# Patient Record
Sex: Male | Born: 1965 | Race: Black or African American | Hispanic: No | Marital: Married | Smoking: Never smoker
Health system: Southern US, Community
[De-identification: ages and names within clinical notes are randomized; demographics above are authoritative.]

## PROBLEM LIST (undated history)

## (undated) DIAGNOSIS — E119 Type 2 diabetes mellitus without complications: Secondary | ICD-10-CM

## (undated) DIAGNOSIS — J45909 Unspecified asthma, uncomplicated: Secondary | ICD-10-CM

---

## 2008-09-25 ENCOUNTER — Emergency Department (HOSPITAL_BASED_OUTPATIENT_CLINIC_OR_DEPARTMENT_OTHER): Admission: EM | Admit: 2008-09-25 | Discharge: 2008-09-25 | Payer: Self-pay | Admitting: Emergency Medicine

## 2008-09-25 ENCOUNTER — Ambulatory Visit: Payer: Self-pay | Admitting: Diagnostic Radiology

## 2008-09-27 ENCOUNTER — Emergency Department (HOSPITAL_BASED_OUTPATIENT_CLINIC_OR_DEPARTMENT_OTHER): Admission: EM | Admit: 2008-09-27 | Discharge: 2008-09-27 | Payer: Self-pay | Admitting: Emergency Medicine

## 2008-09-27 ENCOUNTER — Ambulatory Visit: Payer: Self-pay | Admitting: Radiology

## 2010-12-24 LAB — DIFFERENTIAL
Basophils Absolute: 0.1 10*3/uL (ref 0.0–0.1)
Basophils Relative: 1 % (ref 0–1)
Eosinophils Absolute: 0.1 10*3/uL (ref 0.0–0.7)
Neutro Abs: 4.2 10*3/uL (ref 1.7–7.7)
Neutrophils Relative %: 58 % (ref 43–77)

## 2010-12-24 LAB — COMPREHENSIVE METABOLIC PANEL
ALT: 24 U/L (ref 0–53)
Alkaline Phosphatase: 64 U/L (ref 39–117)
BUN: 6 mg/dL (ref 6–23)
CO2: 27 mEq/L (ref 19–32)
GFR calc non Af Amer: >60 mL/min (ref >60)
Glucose, Bld: 232 mg/dL — ABNORMAL HIGH (ref 70–99)
Potassium: 3.7 mEq/L (ref 3.5–5.1)
Sodium: 140 mEq/L (ref 135–145)
Total Protein: 7.9 g/dL (ref 6.0–8.3)

## 2010-12-24 LAB — POCT I-STAT 3, ART BLOOD GAS (G3+)
pCO2 arterial: 26.3 mmHg — ABNORMAL LOW (ref 35.0–45.0)
pH, Arterial: 7.545 — ABNORMAL HIGH (ref 7.350–7.450)
pO2, Arterial: 69 mmHg — ABNORMAL LOW (ref 80.0–100.0)

## 2010-12-24 LAB — CBC
HCT: 41.3 % (ref 39.0–52.0)
Hemoglobin: 12.8 g/dL — ABNORMAL LOW (ref 13.0–17.0)
MCHC: 31 g/dL (ref 30.0–36.0)
RBC: 5.88 MIL/uL — ABNORMAL HIGH (ref 4.22–5.81)
RDW: 13.3 % (ref 11.5–15.5)

## 2011-01-21 ENCOUNTER — Emergency Department (HOSPITAL_BASED_OUTPATIENT_CLINIC_OR_DEPARTMENT_OTHER)
Admission: EM | Admit: 2011-01-21 | Discharge: 2011-01-21 | Disposition: A | Discharge status: Home / Self Care | Payer: BC Managed Care – HMO | Attending: Emergency Medicine | Admitting: Emergency Medicine

## 2011-01-21 DIAGNOSIS — S01501A Unspecified open wound of lip, initial encounter: Secondary | ICD-10-CM | POA: Insufficient documentation

## 2011-01-21 DIAGNOSIS — X58XXXA Exposure to other specified factors, initial encounter: Secondary | ICD-10-CM | POA: Insufficient documentation

## 2015-07-09 ENCOUNTER — Emergency Department (HOSPITAL_BASED_OUTPATIENT_CLINIC_OR_DEPARTMENT_OTHER): Payer: BLUE CROSS/BLUE SHIELD

## 2015-07-09 ENCOUNTER — Emergency Department (HOSPITAL_BASED_OUTPATIENT_CLINIC_OR_DEPARTMENT_OTHER)
Admission: EM | Admit: 2015-07-09 | Discharge: 2015-07-09 | Disposition: A | Discharge status: Home / Self Care | Payer: BLUE CROSS/BLUE SHIELD | Attending: Emergency Medicine | Admitting: Emergency Medicine

## 2015-07-09 ENCOUNTER — Encounter (HOSPITAL_BASED_OUTPATIENT_CLINIC_OR_DEPARTMENT_OTHER): Payer: Self-pay | Admitting: Emergency Medicine

## 2015-07-09 DIAGNOSIS — R059 Cough, unspecified: Secondary | ICD-10-CM

## 2015-07-09 DIAGNOSIS — J45909 Unspecified asthma, uncomplicated: Secondary | ICD-10-CM | POA: Insufficient documentation

## 2015-07-09 DIAGNOSIS — E119 Type 2 diabetes mellitus without complications: Secondary | ICD-10-CM | POA: Insufficient documentation

## 2015-07-09 DIAGNOSIS — R05 Cough: Secondary | ICD-10-CM

## 2015-07-09 DIAGNOSIS — R51 Headache: Secondary | ICD-10-CM | POA: Insufficient documentation

## 2015-07-09 HISTORY — DX: Unspecified asthma, uncomplicated: J45.909

## 2015-07-09 HISTORY — DX: Type 2 diabetes mellitus without complications: E11.9

## 2015-07-09 MED ORDER — GUAIFENESIN-CODEINE 100-10 MG/5ML PO SOLN: 10.0000 mL | Freq: Four times a day (QID) | ORAL | Status: DC | PRN

## 2015-07-09 NOTE — Discharge Instructions (Signed)
Robitussin with codeine as prescribed as needed for cough.  Return to the emergency department if you develop chest pain, difficulty breathing, high fever, or other new and concerning symptoms.   Cough, Adult Coughing is a reflex that clears your throat and your airways. Coughing helps to heal and protect your lungs. It is normal to cough occasionally, but a cough that happens with other symptoms or lasts a long time may be a sign of a condition that needs treatment. A cough may last only 2-3 weeks (acute), or it may last longer than 8 weeks (chronic). CAUSES Coughing is commonly caused by:  Breathing in substances that irritate your lungs.  A viral or bacterial respiratory infection.  Allergies.  Asthma.  Postnasal drip.  Smoking.  Acid backing up from the stomach into the esophagus (gastroesophageal reflux).  Certain medicines.  Chronic lung problems, including COPD (or rarely, lung cancer).  Other medical conditions such as heart failure. HOME CARE INSTRUCTIONS  Pay attention to any changes in your symptoms. Take these actions to help with your discomfort:  Take medicines only as told by your health care provider.  If you were prescribed an antibiotic medicine, take it as told by your health care provider. Do not stop taking the antibiotic even if you start to feel better.  Talk with your health care provider before you take a cough suppressant medicine.  Drink enough fluid to keep your urine clear or pale yellow.  If the air is dry, use a cold steam vaporizer or humidifier in your bedroom or your home to help loosen secretions.  Avoid anything that causes you to cough at work or at home.  If your cough is worse at night, try sleeping in a semi-upright position.  Avoid cigarette smoke. If you smoke, quit smoking. If you need help quitting, ask your health care provider.  Avoid caffeine.  Avoid alcohol.  Rest as needed. SEEK MEDICAL CARE IF:   You have new  symptoms.  You cough up pus.  Your cough does not get better after 2-3 weeks, or your cough gets worse.  You cannot control your cough with suppressant medicines and you are losing sleep.  You develop pain that is getting worse or pain that is not controlled with pain medicines.  You have a fever.  You have unexplained weight loss.  You have night sweats. SEEK IMMEDIATE MEDICAL CARE IF:  You cough up blood.  You have difficulty breathing.  Your heartbeat is very fast.   This information is not intended to replace advice given to you by your health care provider. Make sure you discuss any questions you have with your health care provider.   Document Released: 02/22/2011 Document Revised: 05/17/2015 Document Reviewed: 11/02/2014 Elsevier Interactive Patient Education [employer] Inc.

## 2015-07-09 NOTE — ED Provider Notes (Signed)
CSN: [id number]     Arrival date & time 07/09/15  2010 History  By signing my name below, I, Ronney Lion, attest that this documentation has been prepared under the direction and in the presence of Geoffery Lyons, MD. Electronically Signed: Ronney Lion, ED Scribe. 07/09/2015. 11:17PM.    Chief Complaint  Patient presents with  . Cough   The history is provided by the patient. No language interpreter was used.    HPI Comments: [facility] Walsh is a 49 y.o. male with a history of DM and asthma, who presents to the Emergency Department complaining of a constant dry cough. He states he came in tonight because he had a headache tonight after a prolonged coughing episode. Talking exacerbates his cough. Patient states he had a similar cough in the past that progressively developed into pneumonia, so he came in today to prevent that from recurring. Patient takes Ramapril for HTN. He states he has not had an asthmatic exacerbation in several years, but he has an inhaler that he uses in case. He denies a history of smoking. He also denies myalgias, fever, chest pain, or SOB.   Past Medical History  Diagnosis Date  . Diabetes mellitus without complication (HCC)   . Asthma    History reviewed. No pertinent past surgical history. History reviewed. No pertinent family history. Social History  Substance Use Topics  . Smoking status: Never Smoker   . Smokeless tobacco: None  . Alcohol Use: Yes    Review of Systems  Constitutional: Negative for fever.  Respiratory: Positive for cough. Negative for shortness of breath.   Cardiovascular: Negative for chest pain.  Musculoskeletal: Negative for myalgias.  All other systems reviewed and are negative.  Allergies  Review of patient's allergies indicates no known allergies.  Home Medications   Prior to Admission medications   Not on File   BP 143/83 mmHg  Pulse 80  Temp(Src) 98.3 F (36.8 C) (Oral)  Resp 20  Ht  (1.753 m)  Wt 180 lb (81.647 kg)   BMI 26.57 kg/m2  SpO2 99% Physical Exam  Constitutional: He is oriented to person, place, and time. He appears well-developed and well-nourished. No distress.  HENT:  Head: Normocephalic and atraumatic.  Mouth/Throat: Oropharynx is clear and moist.  Eyes: Conjunctivae and EOM are normal.  Neck: Neck supple. No tracheal deviation present.  Cardiovascular: Normal rate.   Pulmonary/Chest: Effort normal. No respiratory distress. He has no wheezes. He has no rales.  Musculoskeletal: Normal range of motion.  Lymphadenopathy:    He has no cervical adenopathy.  Neurological: He is alert and oriented to person, place, and time.  Skin: Skin is warm and dry.  Psychiatric: He has a normal mood and affect. His behavior is normal.  Nursing note and vitals reviewed.   ED Course  Procedures (including critical care time)  DIAGNOSTIC STUDIES: Oxygen Saturation is 99% on RA, normal by my interpretation.    COORDINATION OF CARE: 11:13 P - Suspect viral URI. Discussed treatment plan with pt at bedside which includes Rx cough medications. Strict return precautions given. Pt verbalized understanding and agreed to plan.   Imaging Review Dg Chest 2 View  07/09/2015  CLINICAL DATA:  Acute onset of cough.  Initial encounter. EXAM: CHEST  2 VIEW COMPARISON:  Chest radiograph performed 09/27/2008 FINDINGS: The lungs are well-aerated and clear. There is no evidence of focal opacification, pleural effusion or pneumothorax. The heart is normal in size; the mediastinal contour is within normal limits. No  acute osseous abnormalities are seen. IMPRESSION: No acute cardiopulmonary process seen. Electronically Signed   By: Roanna RaiderJeffery  Chang M.D.   On: 07/09/2015 20:46   I have personally reviewed and evaluated these images and lab results as part of my medical decision-making.  MDM   Final diagnoses:  None    Patient presents with complaints of cough and congestion since earlier this morning. He is on an ACE  inhibitor, however this does not sound like an ACE inhibitor induced side effect. His lungs are clear and chest x-ray reveals no acute process. I suspect he has the beginning of a viral URI. He will be given cough medication which he can take as needed.   I personally performed the services described in this documentation, which was scribed in my presence. The recorded information has been reviewed and is accurate.       Geoffery Lyonsouglas Armonie Staten, MD 07/10/15 [phone]

## 2015-07-09 NOTE — ED Notes (Signed)
Pt in c/o constant dry cough onset this am. Pt takes Ramapril. NAD.

## 2016-05-13 IMAGING — DX DG CHEST 2V
2 series · 2 of 2 positions shown · non-contrast
Comparison: Chest radiograph performed 09/27/2008

CLINICAL DATA: Acute onset of cough.  Initial encounter.

EXAM:
CHEST  2 VIEW

[chest pa]
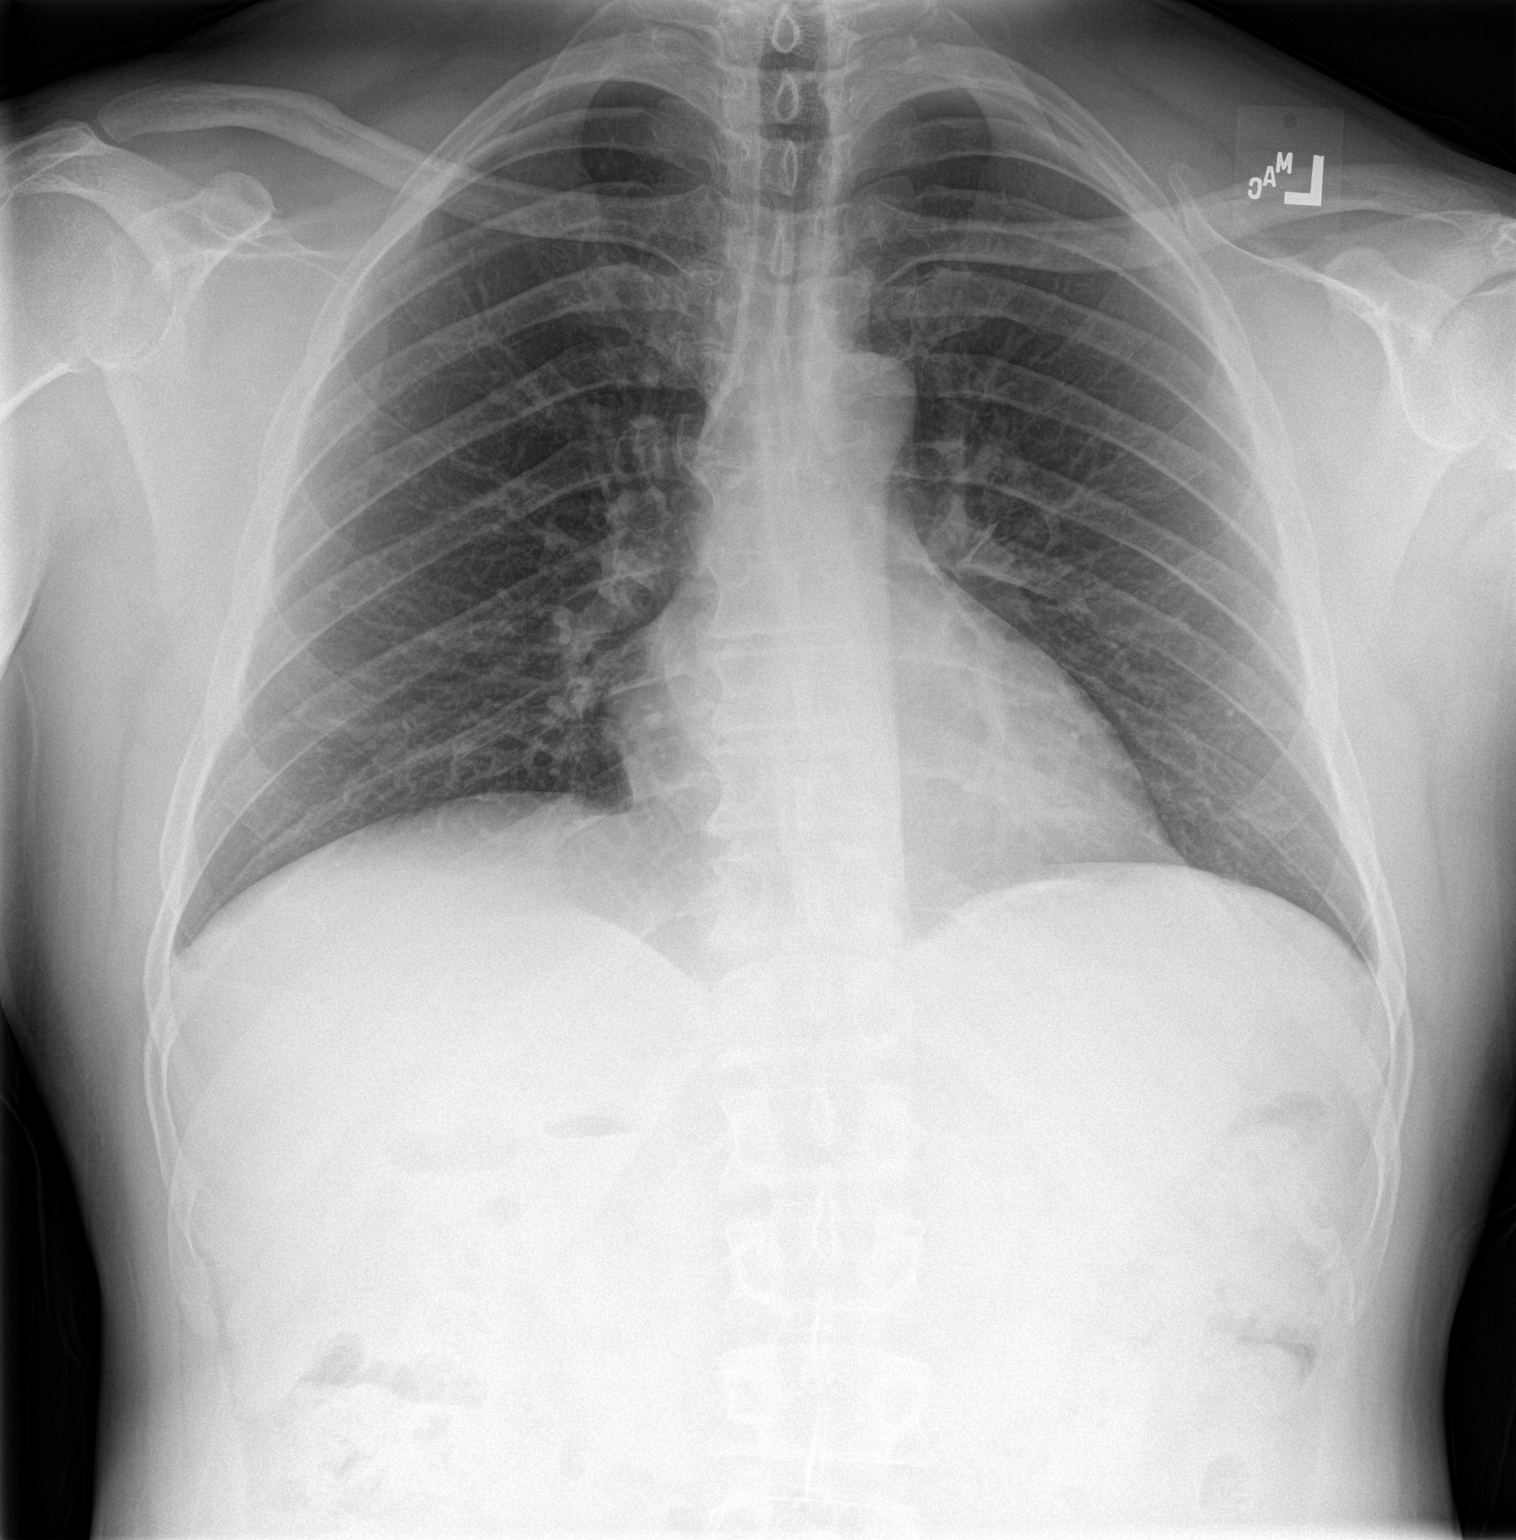

[chest lat]
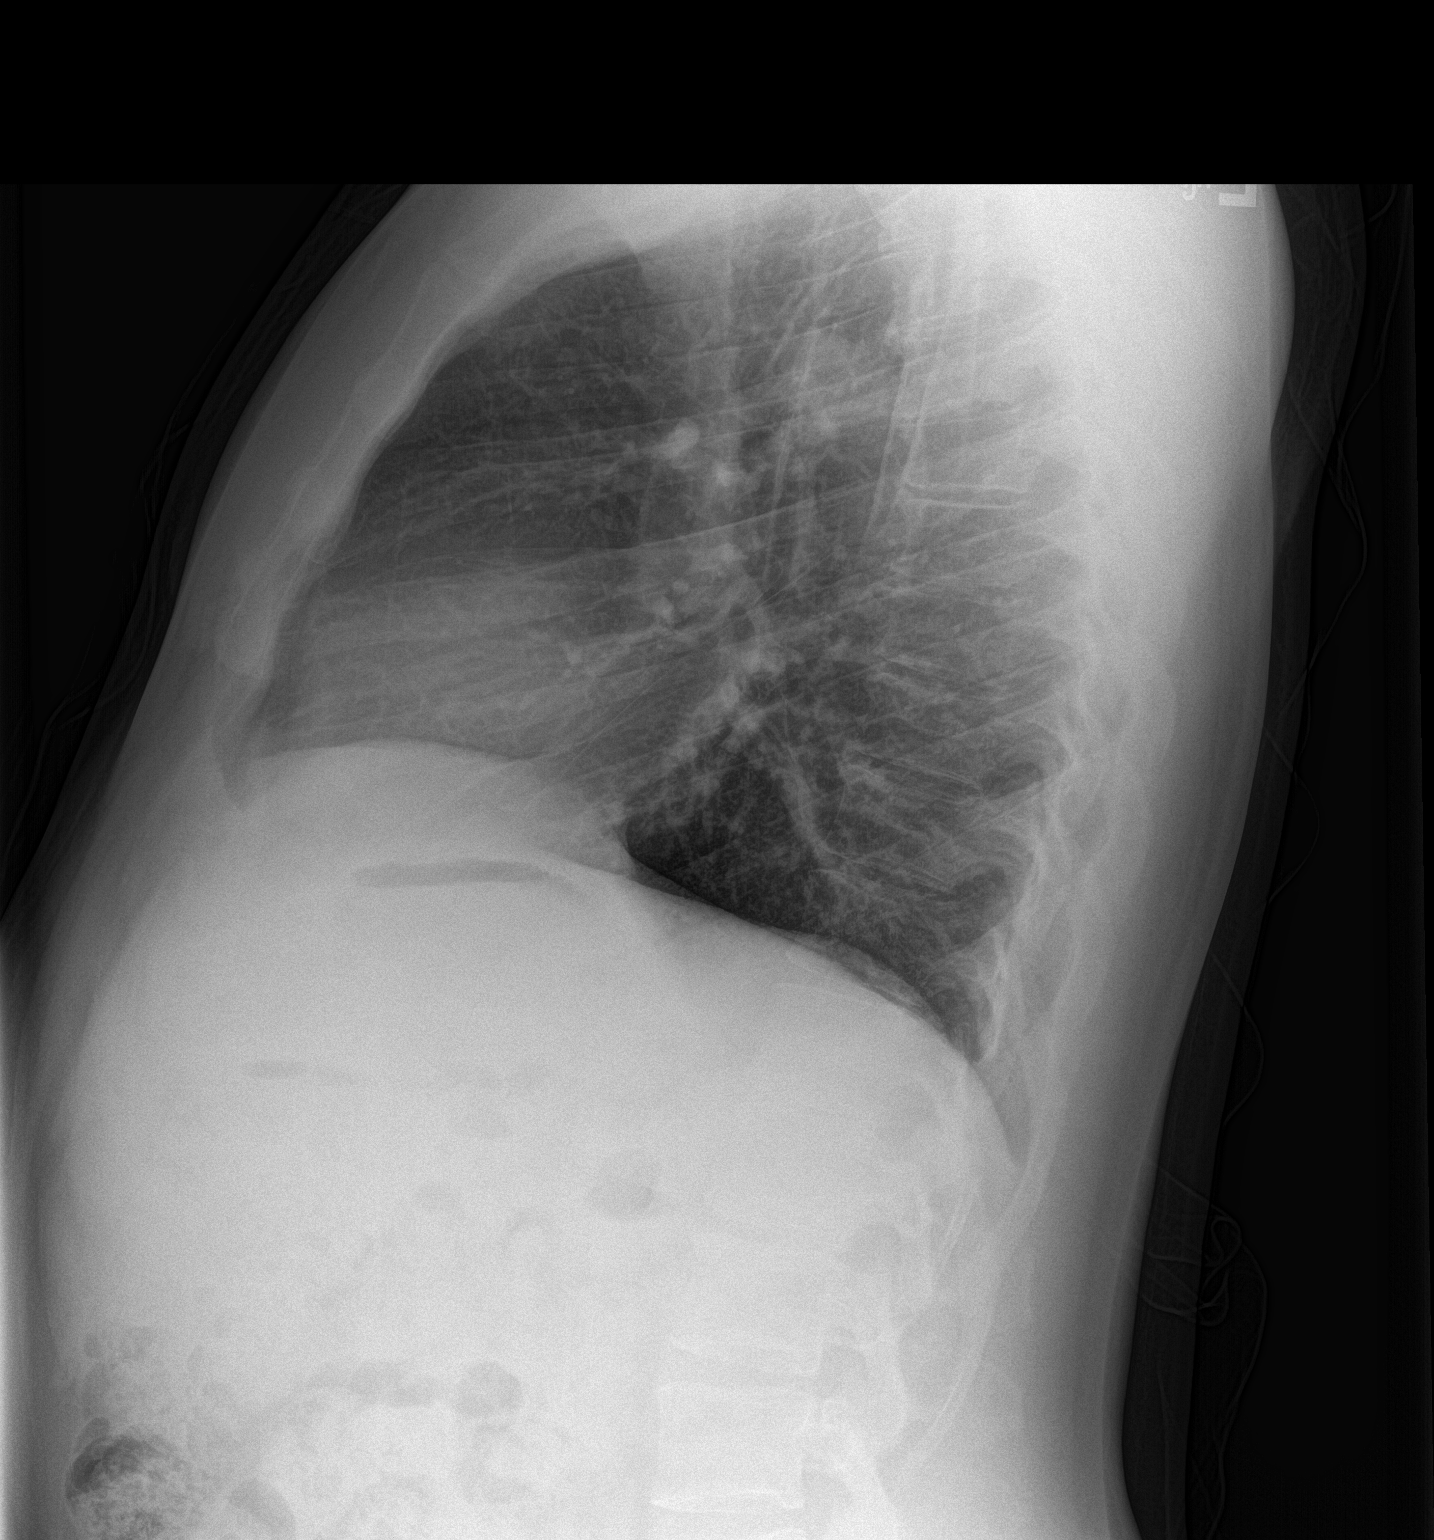

[2 of 2 positions shown; findings below may reference images not displayed]

FINDINGS: The lungs are well-aerated and clear. There is no evidence of focal
opacification, pleural effusion or pneumothorax.

The heart is normal in size; the mediastinal contour is within
normal limits. No acute osseous abnormalities are seen.
IMPRESSION: No acute cardiopulmonary process seen.

## 2019-04-21 ENCOUNTER — Other Ambulatory Visit: Payer: Self-pay

## 2019-04-21 DIAGNOSIS — Z20822 Contact with and (suspected) exposure to covid-19: Secondary | ICD-10-CM

## 2019-04-22 LAB — NOVEL CORONAVIRUS, NAA: SARS-CoV-2, NAA: NOT DETECTED

## 2019-08-07 ENCOUNTER — Encounter (HOSPITAL_BASED_OUTPATIENT_CLINIC_OR_DEPARTMENT_OTHER): Payer: Self-pay | Admitting: *Deleted

## 2019-08-07 ENCOUNTER — Emergency Department (HOSPITAL_BASED_OUTPATIENT_CLINIC_OR_DEPARTMENT_OTHER): Payer: BLUE CROSS/BLUE SHIELD

## 2019-08-07 ENCOUNTER — Emergency Department (HOSPITAL_BASED_OUTPATIENT_CLINIC_OR_DEPARTMENT_OTHER)
Admission: EM | Admit: 2019-08-07 | Discharge: 2019-08-07 | Disposition: A | Discharge status: Home / Self Care | Payer: BLUE CROSS/BLUE SHIELD | Attending: Emergency Medicine | Admitting: Emergency Medicine

## 2019-08-07 ENCOUNTER — Other Ambulatory Visit: Payer: Self-pay

## 2019-08-07 DIAGNOSIS — J45909 Unspecified asthma, uncomplicated: Secondary | ICD-10-CM | POA: Insufficient documentation

## 2019-08-07 DIAGNOSIS — U071 COVID-19: Secondary | ICD-10-CM | POA: Diagnosis not present

## 2019-08-07 DIAGNOSIS — Z7984 Long term (current) use of oral hypoglycemic drugs: Secondary | ICD-10-CM | POA: Insufficient documentation

## 2019-08-07 DIAGNOSIS — M62838 Other muscle spasm: Secondary | ICD-10-CM | POA: Diagnosis present

## 2019-08-07 DIAGNOSIS — Z79899 Other long term (current) drug therapy: Secondary | ICD-10-CM | POA: Diagnosis not present

## 2019-08-07 DIAGNOSIS — E119 Type 2 diabetes mellitus without complications: Secondary | ICD-10-CM | POA: Insufficient documentation

## 2019-08-07 LAB — COMPREHENSIVE METABOLIC PANEL
ALT: 27 U/L (ref 0–44)
AST: 23 U/L (ref 15–41)
Albumin: 3.7 g/dL (ref 3.5–5.0)
Alkaline Phosphatase: 65 U/L (ref 38–126)
Anion gap: 11 (ref 5–15)
BUN: 13 mg/dL (ref 6–20)
CO2: 27 mmol/L (ref 22–32)
Calcium: 8.5 mg/dL — ABNORMAL LOW (ref 8.9–10.3)
Chloride: 95 mmol/L — ABNORMAL LOW (ref 98–111)
Creatinine, Ser: 1.06 mg/dL (ref 0.61–1.24)
GFR calc Af Amer: >60 mL/min (ref >60)
GFR calc non Af Amer: >60 mL/min (ref >60)
Glucose, Bld: 364 mg/dL — ABNORMAL HIGH (ref 70–99)
Potassium: 4.2 mmol/L (ref 3.5–5.1)
Sodium: 133 mmol/L — ABNORMAL LOW (ref 135–145)
Total Bilirubin: 0.7 mg/dL (ref 0.3–1.2)
Total Protein: 6.7 g/dL (ref 6.5–8.1)

## 2019-08-07 LAB — CBC WITH DIFFERENTIAL/PLATELET
Abs Immature Granulocytes: 0.01 10*3/uL (ref 0.00–0.07)
Basophils Absolute: 0 10*3/uL (ref 0.0–0.1)
Basophils Relative: 0 %
Eosinophils Absolute: 0 10*3/uL (ref 0.0–0.5)
Eosinophils Relative: 0 %
HCT: 42.7 % (ref 39.0–52.0)
Hemoglobin: 13 g/dL (ref 13.0–17.0)
Immature Granulocytes: 0 %
Lymphocytes Relative: 41 %
Lymphs Abs: 2 10*3/uL (ref 0.7–4.0)
MCH: 21.9 pg — ABNORMAL LOW (ref 26.0–34.0)
MCHC: 30.4 g/dL (ref 30.0–36.0)
MCV: 72 fL — ABNORMAL LOW (ref 80.0–100.0)
Monocytes Absolute: 0.6 10*3/uL (ref 0.1–1.0)
Monocytes Relative: 12 %
Neutro Abs: 2.3 10*3/uL (ref 1.7–7.7)
Neutrophils Relative %: 47 %
Platelets: 187 10*3/uL (ref 150–400)
RBC: 5.93 MIL/uL — ABNORMAL HIGH (ref 4.22–5.81)
RDW: 13.8 % (ref 11.5–15.5)
WBC: 5 10*3/uL (ref 4.0–10.5)
nRBC: 0 % (ref 0.0–0.2)

## 2019-08-07 LAB — SARS CORONAVIRUS 2 AG (30 MIN TAT): SARS Coronavirus 2 Ag: POSITIVE — AB

## 2019-08-07 LAB — MAGNESIUM: Magnesium: 2 mg/dL (ref 1.7–2.4)

## 2019-08-07 LAB — D-DIMER, QUANTITATIVE: D-Dimer, Quant: <0.27 ug/mL-FEU (ref 0.00–0.50)

## 2019-08-07 LAB — TROPONIN I (HIGH SENSITIVITY)
Troponin I (High Sensitivity): 2 ng/L (ref <18)
Troponin I (High Sensitivity): 2 ng/L (ref <18)

## 2019-08-07 MED ORDER — ASPIRIN 81 MG PO CHEW
243.0000 mg | CHEWABLE_TABLET | Freq: Once | ORAL | Status: AC
  Administered 2019-08-07: 243 mg via ORAL
  Filled 2019-08-07: qty 3

## 2019-08-07 MED ORDER — BENZONATATE 100 MG PO CAPS: 100.0000 mg | ORAL_CAPSULE | Freq: Three times a day (TID) | ORAL | 0 refills | Status: DC

## 2019-08-07 NOTE — ED Provider Notes (Signed)
Signout from [place], [place] at shift change See previous providers note for full H&P  Briefly, patient presenting with muscle spasms as well as chest pain.  He is found to be Covid positive, however labs are pending including troponins.  D-dimer is negative.  Chest x-ray is clear.  If all is negative, discharge home with symptomatic treatment for COVID-19.  He is already taking Tessalon, OTC ibuprofen, Tylenol.  Repeat troponin is negative.  Will discharge home with refill of Tessalon, recommending ibuprofen, Tylenol.  Isolation precautions discussed.  Patient understands and agrees with plan.  Return precautions discussed.  Patient vitals stable throughout ED course and discharged in satisfactory condition.   Frederica Kuster, PA-C 08/07/19 2244    Malvin Johns, MD 08/07/19 2332

## 2019-08-07 NOTE — Discharge Instructions (Addendum)
Take Tessalon every 8 hours as needed for cough.  Alternate ibuprofen and Tylenol as prescribed over-the-counter, for your body aches and fever if it develops.  Make sure to stay well-hydrated and get plenty of rest.  Please return to the emergency department if you develop any new or worsening symptoms.

## 2019-08-07 NOTE — ED Provider Notes (Signed)
[facility] Provider Note   CSN: [id number] Arrival date & time: 08/07/19  1506     History   Chief Complaint Chief Complaint  Patient presents with   Spasms    HPI [facility] is a 53 y.o. male history diabetes and asthma.  Patient presents today for bilateral leg cramping that has been going on for 1 day.  Worsening today.  He describes a intermittent severe cramping sensation to both of his legs no clear aggravating or alleviating factors, cramps last a few seconds at a time.  He reports they are all different parts of his legs including Walsh feet, calves, anterior shins, thighs and quads.  He reports he has never had this before.  Additionally patient reports intermittent cough and shortness of breath over Walsh past 3 days he has been taking his albuterol inhaler more often than usual for his symptoms and this has been helping well.  He denies any active shortness of breath at this time.  On review of systems patient endorses is one episode of central chest pressure this morning shortly after waking up that lasted 1-2 minutes and self resolved he did take 1 aspirin during this event and it has not reoccurred, he denies radiation of pain.  Denies fever/chills, headache/vision changes, neck pain, active chest pain/shortness of breath, hemoptysis, abdominal pain, nausea/vomiting, diarrhea, extremity swelling/color change, fall/injury, weakness or any additional concerns.    HPI  Past Medical History:  Diagnosis Date   Asthma    Diabetes mellitus without complication ([place])     There are no active problems to display for this patient.   History reviewed. No pertinent surgical history.      Home Medications    Prior to Admission medications   Medication Sig Start Date End Date Taking? Authorizing Provider  butalbital-aspirin-caffeine [facility]) 50-325-40 MG tablet Take 1 tablet by mouth 2 (two) times daily as needed for headache.   Yes  [provider]  Dapagliflozin Propanediol (FARXIGA PO) Take by mouth.   Yes [provider]  guaiFENesin-codeine 100-10 MG/5ML syrup Take 10 mLs by mouth every 6 (six) hours as needed for cough. 07/09/15   Veryl Speak, MD  JANUMET 50-1000 MG tablet  02/22/19   [provider]  RAMIPRIL PO Take by mouth.    [provider]    Family History No family history on file.  Social History Social History   Tobacco Use   Smoking status: Never Smoker   Smokeless tobacco: Never Used  Substance Use Topics   Alcohol use: Yes   Drug use: Never     Allergies   Patient has no known allergies.   Review of Systems Review of Systems Ten systems are reviewed and are negative for acute change except as noted in Walsh HPI   Physical Exam Updated Vital Signs BP 108/61 (BP Location: Right Arm) Comment: Simultaneous filing. User may not have seen previous data.   Pulse 67 Comment: Simultaneous filing. User may not have seen previous data.   Temp 98.1 F (36.7 C) (Oral)    Resp 18    Ht 5\' 9"  (1.753 m)    Wt 79.4 kg    SpO2 100% Comment: Simultaneous filing. User may not have seen previous data.   BMI 25.84 kg/m   Physical Exam Constitutional:      General: He is not in acute distress.    Appearance: Normal appearance. He is well-developed. He is not ill-appearing or diaphoretic.  HENT:  Head: Normocephalic and atraumatic.     Right Ear: External ear normal.     Left Ear: External ear normal.     Nose: Nose normal.     Mouth/Throat:     Mouth: Mucous membranes are moist.     Pharynx: Oropharynx is clear.  Eyes:     General: Vision grossly intact. Gaze aligned appropriately.     Pupils: Pupils are equal, round, and reactive to light.  Neck:     Musculoskeletal: Normal range of motion.     Trachea: Trachea and phonation normal. No tracheal deviation.  Cardiovascular:     Rate and Rhythm: Normal rate and regular rhythm.     Pulses: Normal pulses.       Heart sounds: Normal heart sounds.  Pulmonary:     Effort: Pulmonary effort is normal. No respiratory distress.     Breath sounds: Normal breath sounds.  Abdominal:     General: There is no distension.     Palpations: Abdomen is soft.     Tenderness: There is no abdominal tenderness. There is no guarding or rebound.  Musculoskeletal: Normal range of motion.     Right lower leg: No edema.     Left lower leg: No edema.  Skin:    General: Skin is warm and dry.  Neurological:     Mental Status: He is alert.     GCS: GCS eye subscore is 4. GCS verbal subscore is 5. GCS motor subscore is 6.     Comments: Speech is clear and goal oriented, follows commands Major Cranial nerves without deficit, no facial droop Moves extremities without ataxia, coordination intact  Psychiatric:        Behavior: Behavior normal.      ED Treatments / Results  Labs (all labs ordered are listed, but only abnormal results are displayed) Labs Reviewed  SARS CORONAVIRUS 2 AG (30 MIN TAT) - Abnormal; Notable for Walsh following components:      Result Value   SARS Coronavirus 2 Ag POSITIVE (*)    All other components within normal limits  CBC WITH DIFFERENTIAL/PLATELET - Abnormal; Notable for Walsh following components:   RBC 5.93 (*)    MCV 72.0 (*)    MCH 21.9 (*)    All other components within normal limits  COMPREHENSIVE METABOLIC PANEL - Abnormal; Notable for Walsh following components:   Sodium 133 (*)    Chloride 95 (*)    Glucose, Bld 364 (*)    Calcium 8.5 (*)    All other components within normal limits  MAGNESIUM  D-DIMER, QUANTITATIVE (NOT AT [facility])  TROPONIN I (HIGH SENSITIVITY)    EKG EKG Interpretation  Date/Time:  Saturday August 07 2019 17:11:23 EST Ventricular Rate:  69 PR Interval:    QRS Duration: 87 QT Interval:  376 QTC Calculation: 403 R Axis:   0 Text Interpretation: Sinus rhythm Low voltage, precordial leads since last tracing no significant change Confirmed by  Rolan BuccoBelfi, Melanie [phone]([id number]) on 08/07/2019 6:09:43 PM   Radiology Dg Chest Portable 1 View  Result Date: 08/07/2019 CLINICAL DATA:  Cough, fever EXAM: PORTABLE CHEST 1 VIEW COMPARISON:  Chest radiograph 07/09/2015 FINDINGS: Monitoring leads overlie Walsh patient. Stable cardiac and mediastinal contours. No consolidative pulmonary opacities. No pleural effusion or pneumothorax. Thoracic spine degenerative changes. IMPRESSION: No acute cardiopulmonary process. Electronically Signed   By: Annia Beltrew  Davis M.D.   On: 08/07/2019 17:57    Procedures Procedures (including critical care time)  Medications Ordered in ED Medications  aspirin chewable tablet 243 mg (243 mg Oral Given 08/07/19 1720)     Initial Impression / Assessment and Plan / ED Course  I have reviewed Walsh triage vital signs and Walsh nursing notes.  Pertinent labs & imaging results that were available during my care of Walsh patient were reviewed by me and considered in my medical decision making (see chart for details).     On initial evaluation patient is well-appearing in no acute distress denies any active chest pain or shortness of breath.  Chest pain self resolved this morning he took 181 mg aspirin at that time.  No shortness of breath for Walsh past few hours after using his albuterol.  He reports ongoing cramping of his legs since yesterday.  Cranial nerves intact, no neuro deficit, no meningeal signs, heart regular rate and rhythm, lungs clear, abdomen soft nontender without peritoneal signs.  He is neurovascular intact to all 4 extremities without evidence of DVT.  Equal strength bilaterally sensation and capillary refill intact.  He has intermittent shortness of breath for Walsh past 3 days, also endorses decreased p.o. intake and fatigue, COVID-19 test will be obtained.  Additionally will obtain CBC, BMP, troponin, D-dimer, chest x-ray and EKG.  Patient will be given Walsh remainder of aspirin. - EKG: Sinus rhythm Low voltage, precordial  leads since last tracing no significant change Confirmed by Rolan Bucco [phone]) on 08/07/2019 6:09:43 PM  CBC nonacute CMP with elevated glucose, otherwise nonacute D-dimer negative Magnesium 2.0 Covid positive - Troponin, chest x-ray is pending. - Patient reevaluated her resting comfortably no acute distress.  States understanding of diagnosis of COVID-19 virus has no further questions.  Denies any active chest pain, vital signs pulse 70 bpm, SPO2 100% on room air, normotensive. - Care handoff given to Buel Ream PA-C at shift change.  Plan of care is to follow-up on remaining labs and imaging, anticipate discharge.  Final disposition per oncoming team.  Rivaldo Hineman was evaluated in Emergency Department on 08/07/2019 for Walsh symptoms described in Walsh history of present illness. He was evaluated in Walsh context of Walsh global COVID-19 pandemic, which necessitated consideration that Walsh patient might be at risk for infection with Walsh SARS-CoV-2 virus that causes COVID-19. Institutional protocols and algorithms that pertain to Walsh evaluation of patients at risk for COVID-19 are in a state of rapid change based on information released by regulatory bodies including Walsh CDC and federal and state organizations. These policies and algorithms were followed during Walsh patient's care in Walsh ED.  Note: Portions of this report may have been transcribed using voice recognition software. Every effort was made to ensure accuracy; however, inadvertent computerized transcription errors may still be present. Final Clinical Impressions(s) / ED Diagnoses   Final diagnoses:  COVID-19 virus infection    ED Discharge Orders    None       Elizabeth Palau 08/07/19 1811    Rolan Bucco, MD 08/07/19 2332

## 2019-08-07 NOTE — ED Triage Notes (Addendum)
Pt reports intermittent muscle cramps in both legs last night. Took magnesium. Also taking albuterol w/steroids and benzonatate for a cough he has had since yesterday

## 2019-08-08 ENCOUNTER — Telehealth: Payer: Self-pay | Admitting: Unknown Physician Specialty

## 2019-08-08 NOTE — Telephone Encounter (Signed)
Called to Discuss with patient about Covid symptoms and the use of bamlanivimab, a monoclonal antibody infusion for those with mild to moderate Covid symptoms and at a high risk of hospitalization.     Pt is qualified for this infusion at the [facility] due to co-morbid conditions and/or a member of an at-risk group.     Unable to reach pt  

## 2019-08-10 ENCOUNTER — Telehealth: Payer: Self-pay | Admitting: Nurse Practitioner

## 2019-08-10 NOTE — Telephone Encounter (Signed)
Called to Discuss with patient about Covid symptoms and the use of bamlanivimab, a monoclonal antibody infusion for those with mild to moderate Covid symptoms and at a high risk of hospitalization.     Pt is qualified for this infusion at the [facility] due to co-morbid conditions and/or a member of an at-risk group.     Unable to reach pt  

## 2020-06-11 IMAGING — DX DG CHEST 1V PORT
1 series · 1 of 1 positions shown · non-contrast
Comparison: Chest radiograph 07/09/2015

CLINICAL DATA: Cough, fever

EXAM:
PORTABLE CHEST 1 VIEW

[chest ap]
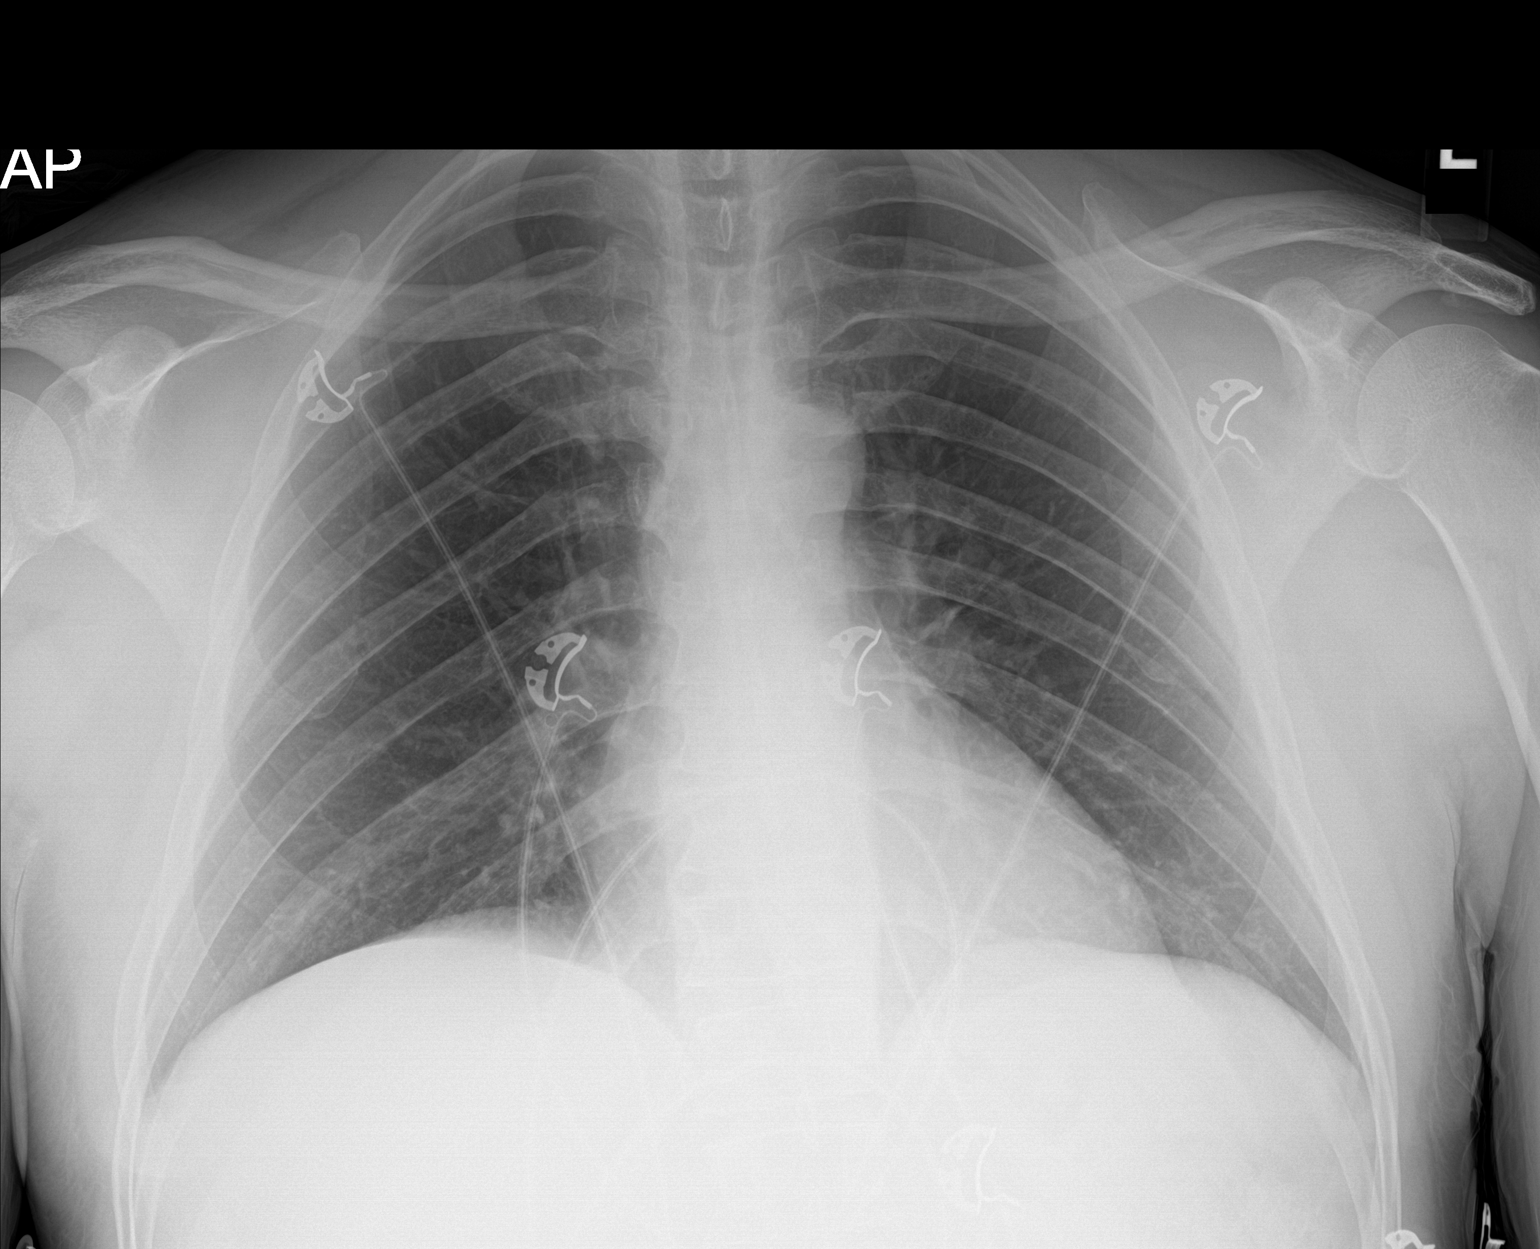

[1 of 1 positions shown; findings below may reference images not displayed]

FINDINGS: Monitoring leads overlie the patient. Stable cardiac and mediastinal
contours. No consolidative pulmonary opacities. No pleural effusion
or pneumothorax. Thoracic spine degenerative changes.
IMPRESSION: No acute cardiopulmonary process.

## 2022-01-19 ENCOUNTER — Other Ambulatory Visit: Payer: Self-pay

## 2022-01-19 ENCOUNTER — Emergency Department (HOSPITAL_BASED_OUTPATIENT_CLINIC_OR_DEPARTMENT_OTHER)
Admission: EM | Admit: 2022-01-19 | Discharge: 2022-01-20 | Disposition: A | Discharge status: Home / Self Care | Payer: BC Managed Care – PPO | Attending: Emergency Medicine | Admitting: Emergency Medicine

## 2022-01-19 ENCOUNTER — Emergency Department (HOSPITAL_BASED_OUTPATIENT_CLINIC_OR_DEPARTMENT_OTHER): Payer: BC Managed Care – PPO | Admitting: Radiology

## 2022-01-19 DIAGNOSIS — R059 Cough, unspecified: Secondary | ICD-10-CM | POA: Diagnosis present

## 2022-01-19 DIAGNOSIS — Z20822 Contact with and (suspected) exposure to covid-19: Secondary | ICD-10-CM | POA: Diagnosis not present

## 2022-01-19 DIAGNOSIS — J9801 Acute bronchospasm: Secondary | ICD-10-CM | POA: Insufficient documentation

## 2022-01-19 DIAGNOSIS — J40 Bronchitis, not specified as acute or chronic: Secondary | ICD-10-CM | POA: Insufficient documentation

## 2022-01-19 MED ORDER — BENZONATATE 100 MG PO CAPS
200.0000 mg | ORAL_CAPSULE | Freq: Once | ORAL | Status: AC
  Administered 2022-01-19: 200 mg via ORAL
  Filled 2022-01-19: qty 2

## 2022-01-19 MED ORDER — ALBUTEROL SULFATE (2.5 MG/3ML) 0.083% IN NEBU: 2.5000 mg | INHALATION_SOLUTION | Freq: Once | RESPIRATORY_TRACT | Status: AC

## 2022-01-19 MED ORDER — AZITHROMYCIN 250 MG PO TABS
500.0000 mg | ORAL_TABLET | Freq: Once | ORAL | Status: AC
  Administered 2022-01-19: 500 mg via ORAL
  Filled 2022-01-19: qty 2

## 2022-01-19 MED ORDER — ALBUTEROL SULFATE HFA 108 (90 BASE) MCG/ACT IN AERS
2.0000 | INHALATION_SPRAY | Freq: Once | RESPIRATORY_TRACT | Status: AC
  Administered 2022-01-19: 2 via RESPIRATORY_TRACT
  Filled 2022-01-19: qty 6.7

## 2022-01-19 MED ORDER — ALBUTEROL SULFATE (2.5 MG/3ML) 0.083% IN NEBU
INHALATION_SOLUTION | RESPIRATORY_TRACT | Status: AC
  Administered 2022-01-19: 2.5 mg via RESPIRATORY_TRACT
  Filled 2022-01-19: qty 3

## 2022-01-19 NOTE — ED Triage Notes (Signed)
He reports recent (1-2 days) cough without fever. He tells [place] that "The cough is real bad and getting worse". Our R.T., Misty Stanley evaluates him here in triage. He is in no distress. ?

## 2022-01-20 LAB — RESP PANEL BY RT-PCR (FLU A&B, COVID) ARPGX2
Influenza A by PCR: NEGATIVE
Influenza B by PCR: NEGATIVE
SARS Coronavirus 2 by RT PCR: NEGATIVE

## 2022-01-20 MED ORDER — PREDNISONE 20 MG PO TABS: ORAL_TABLET | ORAL | 0 refills | Status: DC

## 2022-01-20 MED ORDER — HYDROCODONE-ACETAMINOPHEN 7.5-325 MG/15ML PO SOLN
ORAL | 0 refills | Status: DC
Start: 2022-01-20 — End: 2022-10-15

## 2022-01-20 MED ORDER — PREDNISONE 50 MG PO TABS
60.0000 mg | ORAL_TABLET | Freq: Once | ORAL | Status: AC
Start: 2022-01-20 — End: 2022-01-20
  Administered 2022-01-20: 60 mg via ORAL
  Filled 2022-01-20: qty 1

## 2022-01-20 MED ORDER — BENZONATATE 200 MG PO CAPS: 200.0000 mg | ORAL_CAPSULE | Freq: Three times a day (TID) | ORAL | 0 refills | Status: DC | PRN

## 2022-01-20 MED ORDER — AZITHROMYCIN 250 MG PO TABS: 250.0000 mg | ORAL_TABLET | Freq: Every day | ORAL | 0 refills | Status: DC

## 2022-01-20 NOTE — ED Provider Notes (Signed)
?[facility] EMERGENCY DEPT ?Provider Note ? ? ?CSN: [id number] ?Arrival date & time: 01/19/22  1737 ? ?  ? ?History ? ?Chief Complaint  ?Patient presents with  ? URI  ? ? ?Aaron Walsh is a 56 y.o. male. ? ?HPI ?Patient reports he has severe cough that is causing central chest pain.  He reports he feels somewhat short of breath when he is having coughing episodes.  He denies he had a documented fever at this time.  He does have general fatigue and malaise.  No sore throat or nasal congestion.  No vomiting or diarrhea.  No lower extremity swelling or calf pain.  Patient reports he has had similar symptoms a few times a year for many years.  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  He reports he has had bouts of pneumonia and prior hospitalization. ?  ? ?Home Medications ?Prior to Admission medications   ?Medication Sig Start Date End Date Taking? Authorizing Provider  ?azithromycin (ZITHROMAX) 250 MG tablet Take 1 tablet (250 mg total) by mouth daily. 01/20/22  Yes Charlesetta Shanks, MD  ?benzonatate (TESSALON) 200 MG capsule Take 1 capsule (200 mg total) by mouth 3 (three) times daily as needed for cough. 01/20/22  Yes Charlesetta Shanks, MD  ?HYDROcodone-acetaminophen (HYCET) 7.5-325 mg/15 ml solution 15 mL every 6 hours as needed for coughing. 01/20/22  Yes Charlesetta Shanks, MD  ?predniSONE (DELTASONE) 20 MG tablet 2 tabs po daily x 4 days 01/20/22  Yes Bren Borys, Jeannie Done, MD  ?benzonatate (TESSALON) 100 MG capsule Take 1 capsule (100 mg total) by mouth every 8 (eight) hours. 08/07/19   Law, Bea Graff, PA-C  ?butalbital-aspirin-caffeine [facility]) 50-325-40 MG tablet Take 1 tablet by mouth 2 (two) times daily as needed for headache.    [provider]  ?Dapagliflozin Propanediol (FARXIGA PO) Take by mouth.    [provider]  ?guaiFENesin-codeine 100-10 MG/5ML syrup Take 10 mLs by mouth every 6 (six) hours as needed for cough. 07/09/15   Veryl Speak, MD  ?Carlyn Reichert  50-1000 MG tablet  02/22/19   [provider]  ?RAMIPRIL PO Take by mouth.    [provider]  ?   ? ?Allergies    ?Patient has no known allergies.   ? ?Review of Systems   ?Review of Systems ?10 systems reviewed and negative except as per HPI ?Physical Exam ?Updated Vital Signs ?BP 136/84 (BP Location: Right Arm)   Pulse 67   Temp 98.3 ?F (36.8 ?C) (Oral)   Resp 16   SpO2 100%  ?Physical Exam ?Constitutional:   ?   Comments: Alert nontoxic.  No respiratory distress.  Intermittent harsh cough paroxysms  ?HENT:  ?   Nose: Nose normal.  ?   Mouth/Throat:  ?   Mouth: Mucous membranes are moist.  ?   Pharynx: Oropharynx is clear.  ?Eyes:  ?   Extraocular Movements: Extraocular movements intact.  ?Cardiovascular:  ?   Rate and Rhythm: Normal rate and regular rhythm.  ?Pulmonary:  ?   Comments: No respiratory distress but severe cough paroxysms.  Scattered slight expiratory wheeze but good airflow to the bases. ?Abdominal:  ?   General: There is no distension.  ?   Palpations: Abdomen is soft.  ?   Tenderness: There is no abdominal tenderness. There is no guarding.  ?Musculoskeletal:     ?   General: No swelling or tenderness. Normal range of motion.  ?   Right lower leg: No  edema.  ?   Left lower leg: No edema.  ?Skin: ?   General: Skin is warm and dry.  ?Neurological:  ?   General: No focal deficit present.  ?   Mental Status: He is oriented to person, place, and time.  ?   Motor: No weakness.  ?   Coordination: Coordination normal.  ?Psychiatric:     ?   Mood and Affect: Mood normal.  ? ? ?ED Results / Procedures / Treatments   ?Labs ?(all labs ordered are listed, but only abnormal results are displayed) ?Labs Reviewed  ?RESP PANEL BY RT-PCR (FLU A&B, COVID) [id number]  ? ? ?EKG ?None ? ?Radiology ?DG Chest 2 View ? ?Result Date: 01/19/2022 ?CLINICAL DATA:  Cough. EXAM: CHEST - 2 VIEW COMPARISON:  August 07, 2019 FINDINGS: The heart size and mediastinal contours are within normal limits. Both lungs  are clear. The visualized skeletal structures are unremarkable. IMPRESSION: No active cardiopulmonary disease. Electronically Signed   By: Virgina Norfolk M.D.   On: 01/19/2022 18:36   ? ?Procedures ?Procedures  ? ? ?Medications Ordered in ED ?Medications  ?predniSONE (DELTASONE) tablet 60 mg (has no administration in time range)  ?albuterol (PROVENTIL) (2.5 MG/3ML) 0.083% nebulizer solution 2.5 mg (2.5 mg Nebulization Given 01/19/22 1801)  ?albuterol (VENTOLIN HFA) 108 (90 Base) MCG/ACT inhaler 2 puff (2 puffs Inhalation Given 01/19/22 2253)  ?benzonatate (TESSALON) capsule 200 mg (200 mg Oral Given 01/19/22 2305)  ?azithromycin (ZITHROMAX) tablet 500 mg (500 mg Oral Given 01/19/22 2305)  ? ? ?ED Course/ Medical Decision Making/ A&P ?  ?                        ?Medical Decision Making ?Amount and/or Complexity of Data Reviewed ?Radiology: ordered. ? ?Risk ?Prescription drug management. ? ? ?Patient presents as outlined.  Chest x-ray reviewed by radiology does not show any acute findings.  Differential includes bronchitis\pneumonia\bronchospasm\COVID. ? ?COVID test negative. ? ?Patient treated with inhaled albuterol, Tessalon Perles, Zithromax and prednisone history of recurrent pneumonia and bronchitis. Patient does not have significant respiratory distress or hypoxia.  At this time I do not feel that he needs admission.  He is stable for continued home management with careful return precautions. ? ? ? ? ? ? ?Final Clinical Impression(s) / ED Diagnoses ?Final diagnoses:  ?Bronchitis  ?Bronchospasm  ? ? ?Rx / DC Orders ?ED Discharge Orders   ? ?      Ordered  ?  benzonatate (TESSALON) 200 MG capsule  3 times daily PRN       ? 01/20/22 0030  ?  HYDROcodone-acetaminophen (HYCET) 7.5-325 mg/15 ml solution       ? 01/20/22 0030  ?  azithromycin (ZITHROMAX) 250 MG tablet  Daily       ? 01/20/22 0030  ?  predniSONE (DELTASONE) 20 MG tablet       ? 01/20/22 0030  ? ?  ?  ? ?  ? ? ?  ?Charlesetta Shanks, MD ?01/20/22 [phone] ? ?

## 2022-01-20 NOTE — ED Notes (Signed)
Pt verbalizes understanding of discharge instructions. Opportunity for questioning and answers were provided. Pt discharged from ED to home.   ? ?

## 2022-01-20 NOTE — Discharge Instructions (Addendum)
1.  You were given your first dose of prednisone and Zithromax in the emergency department.  Start these prescriptions the day after your visit. ?2.  You may take Tessalon Perles every 8 hours for coughing.  Swallow them whole.  Do not suck on them or chew on them.  If you need additional cough medication, you may take Hycet as prescribed every 6 hours.  This medication contains acetaminophen so do not take additional acetaminophen with it.  Use the albuterol inhaler every 4-6 hours as needed ?3.  Schedule follow-up appoint with your doctor within 3 to 7 days ?4.  Return to emergency department if you have new worsening or concerning symptoms ?

## 2022-03-22 ENCOUNTER — Other Ambulatory Visit: Payer: Self-pay

## 2022-03-22 ENCOUNTER — Emergency Department (HOSPITAL_BASED_OUTPATIENT_CLINIC_OR_DEPARTMENT_OTHER)
Admission: EM | Admit: 2022-03-22 | Discharge: 2022-03-23 | Disposition: A | Discharge status: Home / Self Care | Payer: BC Managed Care – PPO | Attending: Emergency Medicine | Admitting: Emergency Medicine

## 2022-03-22 ENCOUNTER — Encounter (HOSPITAL_BASED_OUTPATIENT_CLINIC_OR_DEPARTMENT_OTHER): Payer: Self-pay | Admitting: Emergency Medicine

## 2022-03-22 ENCOUNTER — Emergency Department (HOSPITAL_BASED_OUTPATIENT_CLINIC_OR_DEPARTMENT_OTHER): Payer: BC Managed Care – PPO

## 2022-03-22 DIAGNOSIS — R1013 Epigastric pain: Secondary | ICD-10-CM

## 2022-03-22 DIAGNOSIS — Z7984 Long term (current) use of oral hypoglycemic drugs: Secondary | ICD-10-CM | POA: Diagnosis not present

## 2022-03-22 DIAGNOSIS — J45909 Unspecified asthma, uncomplicated: Secondary | ICD-10-CM | POA: Diagnosis not present

## 2022-03-22 DIAGNOSIS — K529 Noninfective gastroenteritis and colitis, unspecified: Secondary | ICD-10-CM | POA: Diagnosis not present

## 2022-03-22 DIAGNOSIS — E119 Type 2 diabetes mellitus without complications: Secondary | ICD-10-CM | POA: Insufficient documentation

## 2022-03-22 LAB — BASIC METABOLIC PANEL
Anion gap: 13 (ref 5–15)
BUN: 12 mg/dL (ref 6–20)
CO2: 24 mmol/L (ref 22–32)
Calcium: 10.5 mg/dL — ABNORMAL HIGH (ref 8.9–10.3)
Chloride: 99 mmol/L (ref 98–111)
Creatinine, Ser: 1.08 mg/dL (ref 0.61–1.24)
GFR, Estimated: >60 mL/min (ref >60)
Glucose, Bld: 187 mg/dL — ABNORMAL HIGH (ref 70–99)
Potassium: 4.7 mmol/L (ref 3.5–5.1)
Sodium: 136 mmol/L (ref 135–145)

## 2022-03-22 LAB — CBG MONITORING, ED: Glucose-Capillary: 187 mg/dL — ABNORMAL HIGH (ref 70–99)

## 2022-03-22 LAB — CBC
HCT: 47.2 % (ref 39.0–52.0)
Hemoglobin: 14.4 g/dL (ref 13.0–17.0)
MCH: 21.9 pg — ABNORMAL LOW (ref 26.0–34.0)
MCHC: 30.5 g/dL (ref 30.0–36.0)
MCV: 71.6 fL — ABNORMAL LOW (ref 80.0–100.0)
Platelets: 167 10*3/uL (ref 150–400)
RBC: 6.59 MIL/uL — ABNORMAL HIGH (ref 4.22–5.81)
RDW: 15.1 % (ref 11.5–15.5)
WBC: 5.1 10*3/uL (ref 4.0–10.5)
nRBC: 0 % (ref 0.0–0.2)

## 2022-03-22 LAB — URINALYSIS, ROUTINE W REFLEX MICROSCOPIC
Bilirubin Urine: NEGATIVE
Glucose, UA: >1000 mg/dL — AB
Hgb urine dipstick: NEGATIVE
Ketones, ur: 15 mg/dL — AB
Leukocytes,Ua: NEGATIVE
Nitrite: NEGATIVE
Protein, ur: NEGATIVE mg/dL
Specific Gravity, Urine: 1.034 — ABNORMAL HIGH (ref 1.005–1.030)
pH: 5 (ref 5.0–8.0)

## 2022-03-22 MED ORDER — IOHEXOL 300 MG/ML  SOLN
100.0000 mL | Freq: Once | INTRAMUSCULAR | Status: AC | PRN
  Administered 2022-03-22: 85 mL via INTRAVENOUS

## 2022-03-22 MED ORDER — SODIUM CHLORIDE 0.9 % IV BOLUS
1000.0000 mL | Freq: Once | INTRAVENOUS | Status: AC
Start: 2022-03-22 — End: 2022-03-23
  Administered 2022-03-22: 1000 mL via INTRAVENOUS

## 2022-03-22 MED ORDER — PANTOPRAZOLE SODIUM 40 MG IV SOLR
40.0000 mg | Freq: Once | INTRAVENOUS | Status: AC
  Administered 2022-03-22: 40 mg via INTRAVENOUS
  Filled 2022-03-22: qty 10

## 2022-03-22 MED ORDER — SODIUM CHLORIDE 0.9 % IV SOLN: INTRAVENOUS | Status: DC

## 2022-03-22 NOTE — ED Provider Notes (Signed)
[facility] EMERGENCY DEPT Provider Note   CSN: [id number] Arrival date & time: 03/22/22  1947     History {Add pertinent medical, surgical, social history, OB history to HPI:1} Chief Complaint  Patient presents with   Weakness    Aaron Walsh is a 56 y.o. male.  HPI     Home Medications Prior to Admission medications   Medication Sig Start Date End Date Taking? Authorizing Provider  azithromycin (ZITHROMAX) 250 MG tablet Take 1 tablet (250 mg total) by mouth daily. 01/20/22   Arby Barrette, MD  benzonatate (TESSALON) 100 MG capsule Take 1 capsule (100 mg total) by mouth every 8 (eight) hours. 08/07/19   Law, Waylan Boga, PA-C  benzonatate (TESSALON) 200 MG capsule Take 1 capsule (200 mg total) by mouth 3 (three) times daily as needed for cough. 01/20/22   Arby Barrette, MD  butalbital-aspirin-caffeine [facility]) [phone] MG tablet Take 1 tablet by mouth 2 (two) times daily as needed for headache.    [provider]  Dapagliflozin Propanediol (FARXIGA PO) Take by mouth.    [provider]  guaiFENesin-codeine 100-10 MG/5ML syrup Take 10 mLs by mouth every 6 (six) hours as needed for cough. 07/09/15   Geoffery Lyons, MD  HYDROcodone-acetaminophen (HYCET) 7.5-325 mg/15 ml solution 15 mL every 6 hours as needed for coughing. 01/20/22   Arby Barrette, MD  JANUMET 50-1000 MG tablet  02/22/19   [provider]  predniSONE (DELTASONE) 20 MG tablet 2 tabs po daily x 4 days 01/20/22   Arby Barrette, MD  RAMIPRIL PO Take by mouth.    [provider]      Allergies    Patient has no known allergies.    Review of Systems   Review of Systems  Physical Exam Updated Vital Signs BP 130/86 (BP Location: Right Arm)   Pulse 60   Temp 98.2 F (36.8 C)   Resp 18   SpO2 100%  Physical Exam  ED Results / Procedures / Treatments   Labs (all labs ordered are listed, but only abnormal results are displayed) Labs Reviewed  BASIC  METABOLIC PANEL - Abnormal; Notable for the following components:      Result Value   Glucose, Bld 187 (*)    Calcium 10.5 (*)    All other components within normal limits  CBC - Abnormal; Notable for the following components:   RBC 6.59 (*)    MCV 71.6 (*)    MCH 21.9 (*)    All other components within normal limits  URINALYSIS, ROUTINE W REFLEX MICROSCOPIC - Abnormal; Notable for the following components:   Color, Urine COLORLESS (*)    Specific Gravity, Urine 1.034 (*)    Glucose, UA >1,000 (*)    Ketones, ur 15 (*)    All other components within normal limits  CBG MONITORING, ED - Abnormal; Notable for the following components:   Glucose-Capillary 187 (*)    All other components within normal limits    EKG EKG Interpretation  Date/Time:  Friday March 22 2022 20:07:32 EDT Ventricular Rate:  88 PR Interval:  152 QRS Duration: 78 QT Interval:  358 QTC Calculation: 433 R Axis:   60 Text Interpretation: Normal sinus rhythm Cannot rule out Anterior infarct , age undetermined Abnormal ECG When compared with ECG of 07-Aug-2019 17:11, PREVIOUS ECG IS PRESENT Confirmed by Vanetta Mulders [phone]) on 03/22/2022 10:07:18 PM  Radiology No results found.  Procedures Procedures  {Document cardiac monitor, telemetry assessment procedure when appropriate:1}  Medications  Ordered in ED Medications - No data to display  ED Course/ Medical Decision Making/ A&P                           Medical Decision Making Amount and/or Complexity of Data Reviewed Labs: ordered.   ***  {Document critical care time when appropriate:1} {Document review of labs and clinical decision tools ie heart score, Chads2Vasc2 etc:1}  {Document your independent review of radiology images, and any outside records:1} {Document your discussion with family members, caretakers, and with consultants:1} {Document social determinants of health affecting pt's care:1} {Document your decision making why or why not  admission, treatments were needed:1} Final Clinical Impression(s) / ED Diagnoses Final diagnoses:  None    Rx / DC Orders ED Discharge Orders     None

## 2022-03-22 NOTE — ED Triage Notes (Signed)
Started this morning feels weak and like his heart was racing. No appetite, pain after eating. BP at home 95/58. Felt fine yesterday.

## 2022-03-23 MED ORDER — PANTOPRAZOLE SODIUM 20 MG PO TBEC: 20.0000 mg | DELAYED_RELEASE_TABLET | Freq: Every day | ORAL | 0 refills | Status: DC

## 2022-03-23 MED ORDER — CIPROFLOXACIN HCL 500 MG PO TABS
500.0000 mg | ORAL_TABLET | Freq: Two times a day (BID) | ORAL | 0 refills | Status: DC
Start: 2022-03-23 — End: 2022-10-15

## 2022-03-23 NOTE — Discharge Instructions (Signed)
CT raises concern for very proximal inflammation of the small intestines.  No complications.  Would take the Protonix take the antibiotic Cipro as directed.  Would definitely make an appointment follow-up with Dr. Audley Hose.  Upper endoscopy may be important to take a look and for further follow-up.  Return for any new or worse symptoms.

## 2022-10-15 ENCOUNTER — Other Ambulatory Visit: Payer: Self-pay

## 2022-10-16 MED ORDER — ATORVASTATIN CALCIUM 40 MG PO TABS: 40.0000 mg | ORAL_TABLET | Freq: Every day | ORAL | 0 refills | Status: DC

## 2022-10-16 MED ORDER — TRESIBA FLEXTOUCH 200 UNIT/ML ~~LOC~~ SOPN: 10.0000 [IU] | PEN_INJECTOR | Freq: Every day | SUBCUTANEOUS | 0 refills | Status: DC

## 2022-10-16 MED ORDER — DAPAGLIFLOZIN PROPANEDIOL 10 MG PO TABS: 10.0000 mg | ORAL_TABLET | Freq: Every day | ORAL | 0 refills | Status: DC

## 2022-10-22 ENCOUNTER — Ambulatory Visit: Payer: PRIVATE HEALTH INSURANCE | Admitting: Internal Medicine

## 2022-10-28 ENCOUNTER — Other Ambulatory Visit: Payer: Self-pay

## 2022-10-28 MED ORDER — INSULIN PEN NEEDLE 31G X 8 MM MISC: 3 refills | Status: DC

## 2022-11-13 ENCOUNTER — Other Ambulatory Visit: Payer: Self-pay

## 2022-11-13 ENCOUNTER — Ambulatory Visit: Payer: PRIVATE HEALTH INSURANCE | Admitting: Internal Medicine

## 2022-11-13 DIAGNOSIS — E785 Hyperlipidemia, unspecified: Secondary | ICD-10-CM

## 2022-11-13 DIAGNOSIS — E119 Type 2 diabetes mellitus without complications: Secondary | ICD-10-CM

## 2022-11-13 DIAGNOSIS — I10 Essential (primary) hypertension: Secondary | ICD-10-CM

## 2022-11-13 DIAGNOSIS — J45998 Other asthma: Secondary | ICD-10-CM

## 2022-11-13 MED ORDER — DAPAGLIFLOZIN PROPANEDIOL 10 MG PO TABS: 10.0000 mg | ORAL_TABLET | Freq: Every day | ORAL | 3 refills | Status: DC

## 2022-11-13 MED ORDER — ATORVASTATIN CALCIUM 40 MG PO TABS: 40.0000 mg | ORAL_TABLET | Freq: Every day | ORAL | 0 refills | Status: DC

## 2022-11-13 MED ORDER — JANUMET 50-1000 MG PO TABS: 1.0000 | ORAL_TABLET | Freq: Two times a day (BID) | ORAL | 3 refills | Status: DC

## 2022-11-13 MED ORDER — PANTOPRAZOLE SODIUM 20 MG PO TBEC: 20.0000 mg | DELAYED_RELEASE_TABLET | Freq: Every day | ORAL | 0 refills | Status: DC

## 2022-11-13 MED ORDER — BREZTRI AEROSPHERE 160-9-4.8 MCG/ACT IN AERO: 2.0000 | INHALATION_SPRAY | Freq: Two times a day (BID) | RESPIRATORY_TRACT | 3 refills | Status: DC

## 2022-11-13 MED ORDER — PANTOPRAZOLE SODIUM 20 MG PO TBEC: 20.0000 mg | DELAYED_RELEASE_TABLET | Freq: Every day | ORAL | 3 refills | Status: DC

## 2022-11-13 MED ORDER — FLUTICASONE PROPIONATE 50 MCG/ACT NA SUSP: 2.0000 | Freq: Every day | NASAL | 3 refills | Status: AC

## 2022-11-24 IMAGING — DX DG CHEST 2V
2 series · 2 of 2 positions shown · non-contrast
Comparison: August 07, 2019

CLINICAL DATA: Cough.

EXAM:
CHEST - 2 VIEW

[chest pa]
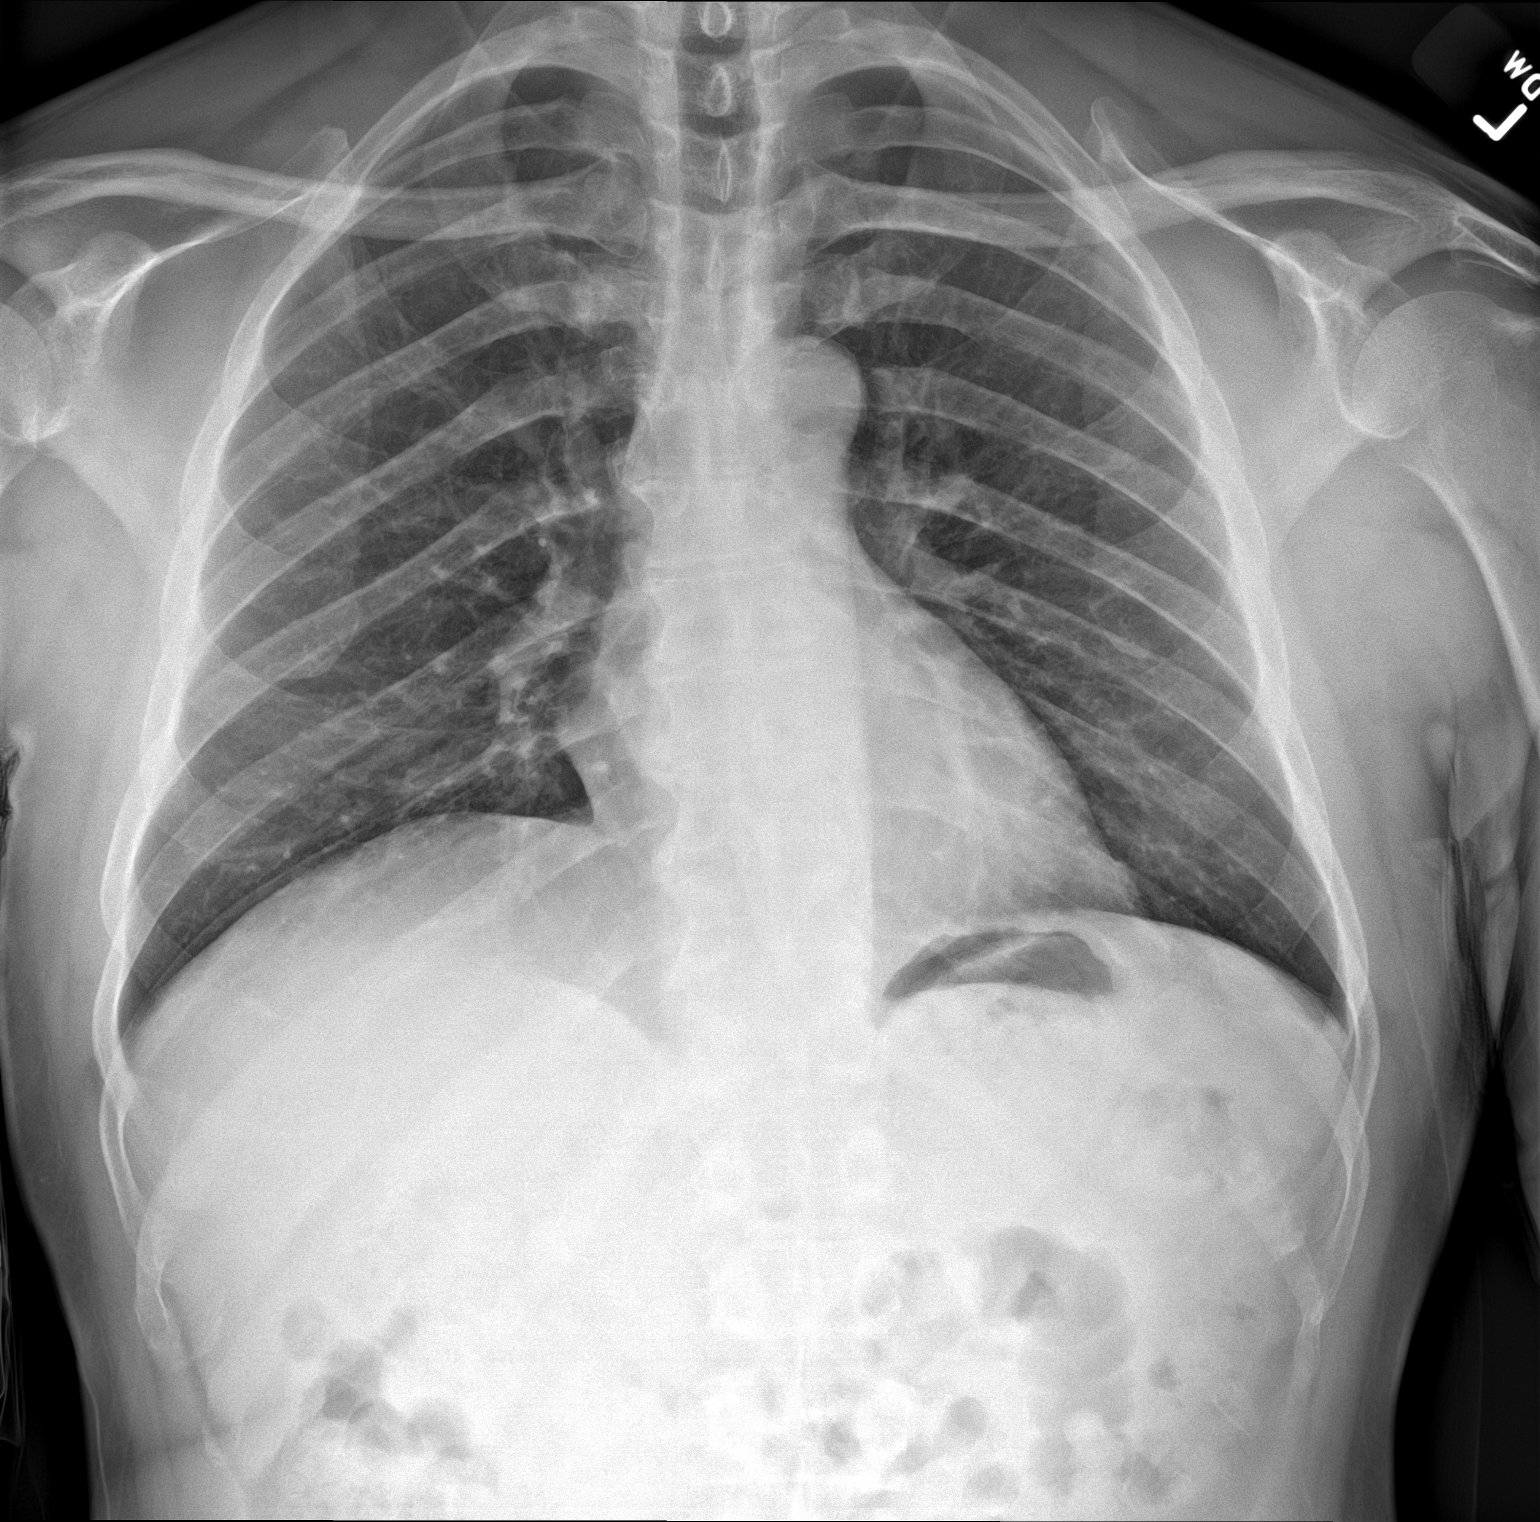

[chest lat]
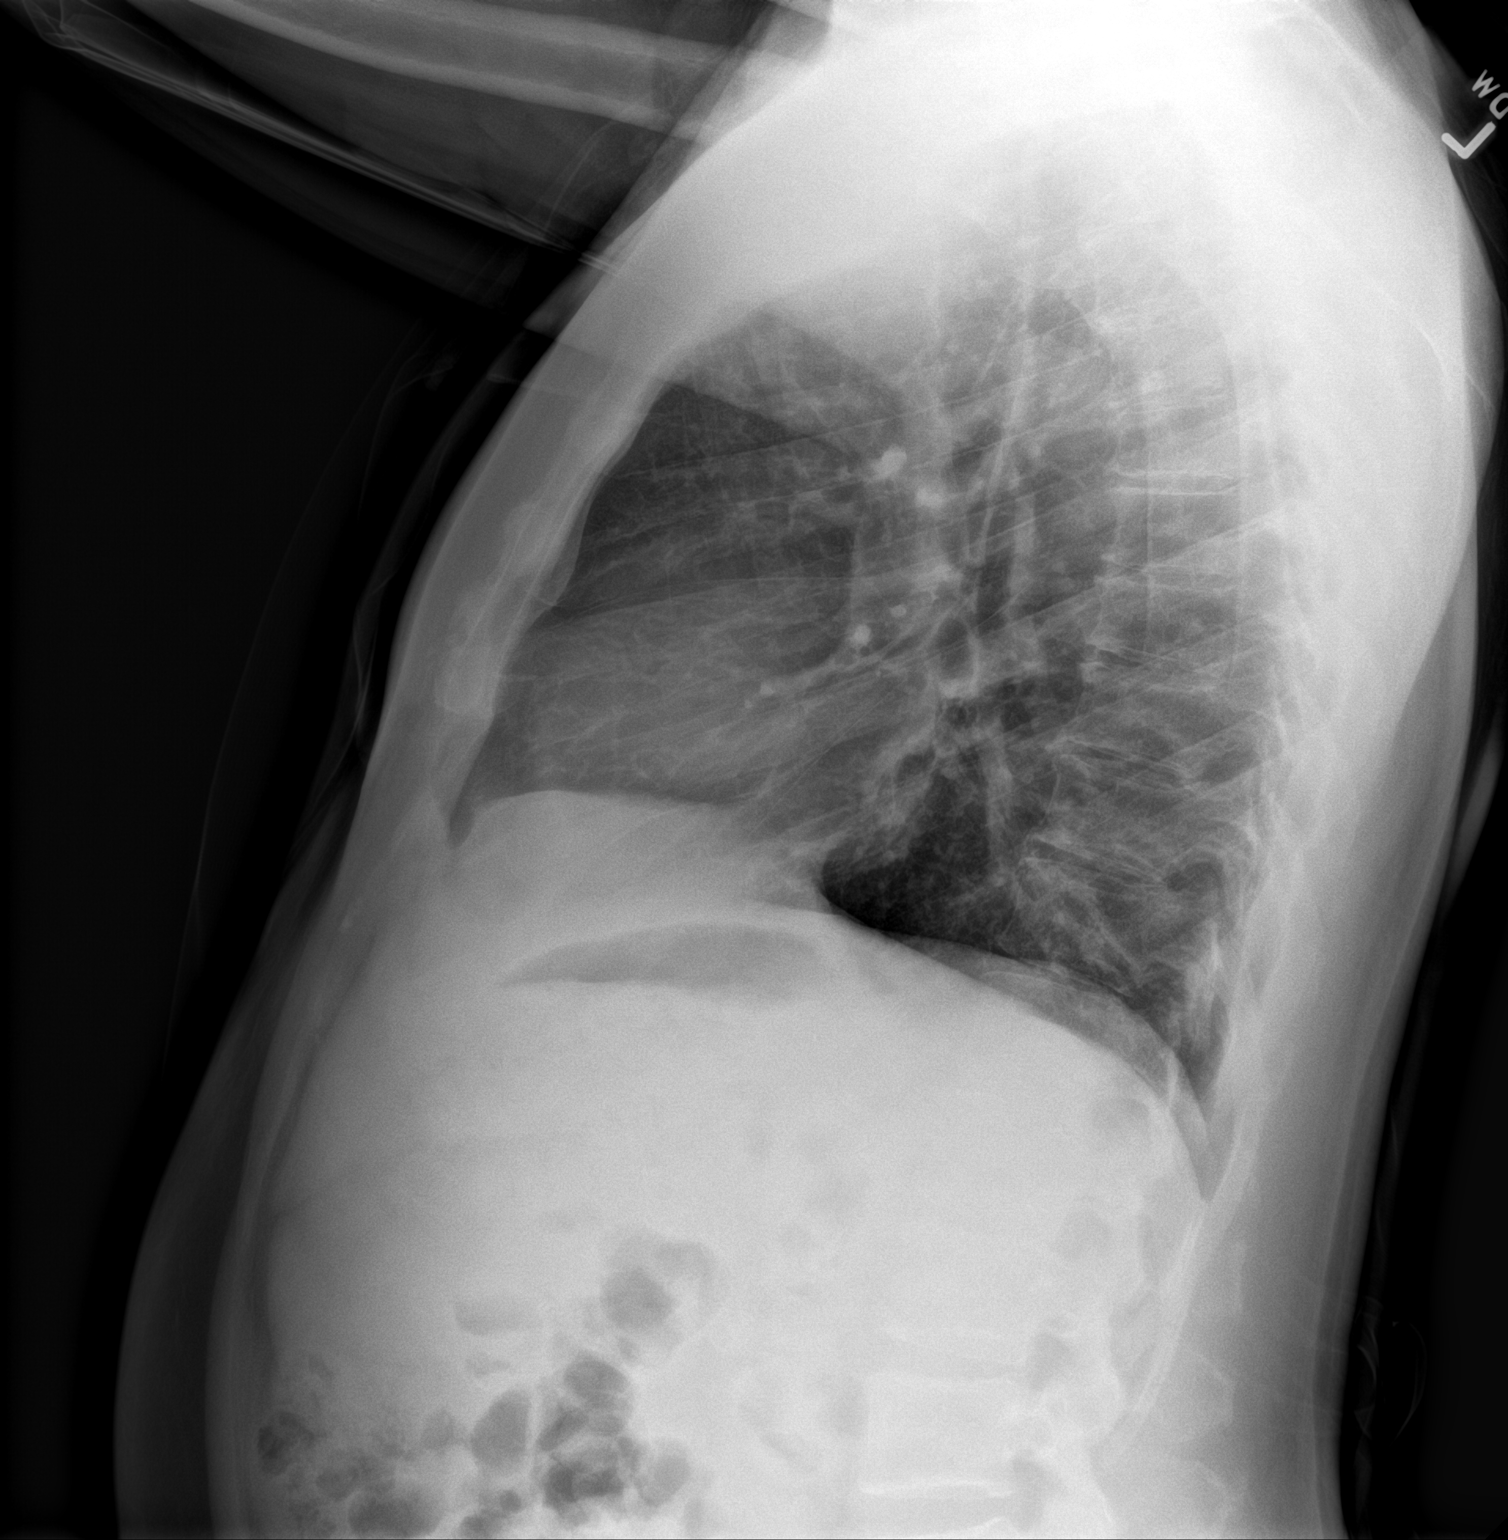

[2 of 2 positions shown; findings below may reference images not displayed]

FINDINGS: The heart size and mediastinal contours are within normal limits.
Both lungs are clear. The visualized skeletal structures are
unremarkable.
IMPRESSION: No active cardiopulmonary disease.

## 2022-12-02 ENCOUNTER — Ambulatory Visit: Payer: PRIVATE HEALTH INSURANCE | Admitting: Internal Medicine

## 2022-12-02 ENCOUNTER — Telehealth: Payer: Self-pay

## 2022-12-02 NOTE — Telephone Encounter (Signed)
Patient LM with answering service asking for call back about his libre sensor and his FMLA and possible PA needed for [employer]

## 2022-12-30 ENCOUNTER — Ambulatory Visit (INDEPENDENT_AMBULATORY_CARE_PROVIDER_SITE_OTHER): Payer: PRIVATE HEALTH INSURANCE | Admitting: Internal Medicine

## 2022-12-30 ENCOUNTER — Encounter: Payer: Self-pay | Admitting: Internal Medicine

## 2022-12-30 VITALS — BP 134/70 | HR 98 | Ht 68.0 in | Wt 170.0 lb

## 2022-12-30 DIAGNOSIS — J4520 Mild intermittent asthma, uncomplicated: Secondary | ICD-10-CM

## 2022-12-30 DIAGNOSIS — R051 Acute cough: Secondary | ICD-10-CM | POA: Insufficient documentation

## 2022-12-30 DIAGNOSIS — I10 Essential (primary) hypertension: Secondary | ICD-10-CM | POA: Insufficient documentation

## 2022-12-30 DIAGNOSIS — E1165 Type 2 diabetes mellitus with hyperglycemia: Secondary | ICD-10-CM | POA: Insufficient documentation

## 2022-12-30 DIAGNOSIS — Z794 Long term (current) use of insulin: Secondary | ICD-10-CM

## 2022-12-30 DIAGNOSIS — E782 Mixed hyperlipidemia: Secondary | ICD-10-CM | POA: Insufficient documentation

## 2022-12-30 DIAGNOSIS — J301 Allergic rhinitis due to pollen: Secondary | ICD-10-CM | POA: Insufficient documentation

## 2022-12-30 DIAGNOSIS — J45909 Unspecified asthma, uncomplicated: Secondary | ICD-10-CM | POA: Insufficient documentation

## 2022-12-30 MED ORDER — LORATADINE 10 MG PO TABS
10.0000 mg | ORAL_TABLET | Freq: Every day | ORAL | 2 refills | Status: DC
Start: 2022-12-30 — End: 2023-01-29

## 2022-12-30 MED ORDER — BENZONATATE 100 MG PO CAPS
100.0000 mg | ORAL_CAPSULE | Freq: Three times a day (TID) | ORAL | 1 refills | Status: AC | PRN
Start: 2022-12-30 — End: 2023-12-30

## 2022-12-30 MED ORDER — AZITHROMYCIN 250 MG PO TABS
ORAL_TABLET | ORAL | 0 refills | Status: AC
Start: 2022-12-30 — End: 2023-01-04

## 2022-12-30 MED ORDER — PREDNISONE 20 MG PO TABS
40.0000 mg | ORAL_TABLET | Freq: Every day | ORAL | 0 refills | Status: AC
Start: 2022-12-30 — End: 2023-01-04

## 2022-12-30 NOTE — Progress Notes (Signed)
Established Patient Office Visit  Subjective:  Patient ID: Aaron Walsh, male    DOB: 12-18-1965  Age: 57 y.o. MRN: [id number]  Chief Complaint  Patient presents with   Follow-up    Cough    Patient comes in with 3-day history of chest congestion and a productive cough which is progressively getting worse.  Patient had similar episode last year with the arrival of spring.  He does have allergies to pollen.  Quickly gets to his lungs and he starts coughing with production of sputum.  He is also having mild shortness of breath and wheezing.   Denies fever or chills, no bodyaches.  No nausea vomiting or diarrhea. He does not have any nasal or sinus congestion, no nasal discharge, and no sore throat.  Most of his symptoms are limited to his chest. Get chest x-ray today and start treatment.    No other concerns at this time.   Past Medical History:  Diagnosis Date   Asthma    Diabetes mellitus without complication     History reviewed. No pertinent surgical history.  Social History   Socioeconomic History   Marital status: Married    Spouse name: Not on file   Number of children: Not on file   Years of education: Not on file   Highest education level: Not on file  Occupational History   Not on file  Tobacco Use   Smoking status: Never   Smokeless tobacco: Never  Vaping Use   Vaping Use: Never used  Substance and Sexual Activity   Alcohol use: Yes   Drug use: Never   Sexual activity: Not on file  Other Topics Concern   Not on file  Social History Narrative   Not on file   Social Determinants of Health   Financial Resource Strain: Not on file  Food Insecurity: Not on file  Transportation Needs: Not on file  Physical Activity: Not on file  Stress: Not on file  Social Connections: Not on file  Intimate Partner Violence: Not on file    History reviewed. No pertinent family history.  No Known Allergies  Review of Systems  Constitutional:  Positive for  malaise/fatigue. Negative for chills, diaphoresis, fever and weight loss.  HENT:  Positive for hearing loss. Negative for congestion, ear discharge, ear pain, sinus pain and sore throat.   Eyes: Negative.   Respiratory:  Positive for cough, sputum production, shortness of breath and wheezing. Negative for hemoptysis and stridor.   Cardiovascular:  Negative for chest pain, palpitations, orthopnea, claudication, leg swelling and PND.  Gastrointestinal:  Negative for abdominal pain, blood in stool, diarrhea, nausea and vomiting.  Genitourinary: Negative.   Musculoskeletal: Negative.   Neurological:  Positive for weakness. Negative for dizziness and headaches.  Psychiatric/Behavioral: Negative.         Objective:   BP 134/70   Pulse 98   Ht  (1.727 m)   Wt 170 lb (77.1 kg)   SpO2 98%   BMI 25.85 kg/m   Vitals:   12/30/22 0931  BP: 134/70  Pulse: 98  Height:  (1.727 m)  Weight: 170 lb (77.1 kg)  SpO2: 98%  BMI (Calculated): 25.85    Physical Exam Vitals and nursing note reviewed.  Constitutional:      Appearance: Normal appearance.  HENT:     Right Ear: Tympanic membrane normal.     Left Ear: Tympanic membrane normal.     Nose: Nose normal.     Mouth/Throat:  Mouth: Mucous membranes are moist.  Cardiovascular:     Rate and Rhythm: Normal rate and regular rhythm.     Pulses: Normal pulses.     Heart sounds: Normal heart sounds.  Pulmonary:     Effort: Pulmonary effort is normal.     Breath sounds: No stridor. Wheezing present. No rhonchi or rales.  Chest:     Chest wall: No tenderness.  Abdominal:     General: Bowel sounds are normal.     Palpations: Abdomen is soft.     Tenderness: There is no abdominal tenderness.  Musculoskeletal:        General: Normal range of motion.     Cervical back: Normal range of motion.  Skin:    General: Skin is warm.  Neurological:     General: No focal deficit present.     Mental Status: He is alert.  Psychiatric:         Mood and Affect: Mood normal.        Behavior: Behavior normal.      No results found for any visits on 12/30/22.  No results found for this or any previous visit (from the past 2160 hour(s)).    Assessment & Plan:  Chest x-ray today.  Prescription sent for prednisone, Z-Pak, Tessalon Perles, and Claritin.  Samples of Breztri inhaler also given to the patient. Problem List Items Addressed This Visit     Acute cough - Primary   Relevant Medications   predniSONE (DELTASONE) 20 MG tablet   benzonatate (TESSALON PERLES) 100 MG capsule   Other Relevant Orders   DG Chest 2 View   Asthmatic bronchitis   Relevant Medications   predniSONE (DELTASONE) 20 MG tablet   azithromycin (ZITHROMAX) 250 MG tablet   loratadine (CLARITIN) 10 MG tablet   Essential hypertension, benign   Seasonal allergic rhinitis due to pollen   Mixed hyperlipidemia   Type 2 diabetes mellitus with hyperglycemia, with long-term current use of insulin    Return in about 1 week (around 01/06/2023).   Total time spent: 30 minutes  Margaretann Loveless, MD  12/30/2022

## 2023-01-08 ENCOUNTER — Other Ambulatory Visit: Payer: Self-pay | Admitting: Internal Medicine

## 2023-01-20 ENCOUNTER — Ambulatory Visit: Payer: PRIVATE HEALTH INSURANCE | Admitting: Internal Medicine

## 2023-01-21 ENCOUNTER — Encounter: Payer: Self-pay | Admitting: Internal Medicine

## 2023-01-29 ENCOUNTER — Encounter: Payer: Self-pay | Admitting: Internal Medicine

## 2023-01-29 ENCOUNTER — Ambulatory Visit (INDEPENDENT_AMBULATORY_CARE_PROVIDER_SITE_OTHER): Payer: PRIVATE HEALTH INSURANCE | Admitting: Internal Medicine

## 2023-01-29 VITALS — BP 120/80 | HR 81 | Ht 69.0 in | Wt 173.8 lb

## 2023-01-29 DIAGNOSIS — I10 Essential (primary) hypertension: Secondary | ICD-10-CM | POA: Diagnosis not present

## 2023-01-29 DIAGNOSIS — E782 Mixed hyperlipidemia: Secondary | ICD-10-CM

## 2023-01-29 DIAGNOSIS — E119 Type 2 diabetes mellitus without complications: Secondary | ICD-10-CM

## 2023-01-29 DIAGNOSIS — Z794 Long term (current) use of insulin: Secondary | ICD-10-CM | POA: Diagnosis not present

## 2023-01-29 DIAGNOSIS — E1165 Type 2 diabetes mellitus with hyperglycemia: Secondary | ICD-10-CM

## 2023-01-29 LAB — GLUCOSE, POCT (MANUAL RESULT ENTRY): POC Glucose: 199 mg/dl — AB (ref 70–99)

## 2023-01-29 MED ORDER — ATORVASTATIN CALCIUM 10 MG PO TABS
10.0000 mg | ORAL_TABLET | Freq: Every day | ORAL | 0 refills | Status: DC
Start: 2023-01-29 — End: 2024-03-31

## 2023-01-29 MED ORDER — TRESIBA FLEXTOUCH 200 UNIT/ML ~~LOC~~ SOPN
14.0000 [IU] | PEN_INJECTOR | Freq: Every day | SUBCUTANEOUS | Status: DC
Start: 2023-01-29 — End: 2023-06-27

## 2023-01-29 MED ORDER — RAMIPRIL 2.5 MG PO CAPS
2.5000 mg | ORAL_CAPSULE | Freq: Every day | ORAL | 0 refills | Status: DC
Start: 2023-01-29 — End: 2023-06-27

## 2023-01-29 NOTE — Progress Notes (Signed)
Established Patient Office Visit  Subjective:  Patient ID: Aaron Walsh, male    DOB: April 04, 1966  Age: 57 y.o. MRN: [id number]  Chief Complaint  Patient presents with   Follow-up    Follow up lab results    Cough has improved. Saw a pulmonologist in [place] who did not recommend any additional Rx as X-ray was negative. No new complaints, here for lab review and medication refills. Labs reviewed and notable for uncontrolled diabetes, A1c not at target v high at 10.1, lipids not at target with unremarkable cmp. Denies any hypoglycemic episodes and home bg readings have not been at target. Admits to delay in filling his insulin due to insurance issues.    No other concerns at this time.   Past Medical History:  Diagnosis Date   Asthma    Diabetes mellitus without complication (HCC)     No past surgical history on file.  Social History   Socioeconomic History   Marital status: Married    Spouse name: Not on file   Number of children: Not on file   Years of education: Not on file   Highest education level: Not on file  Occupational History   Not on file  Tobacco Use   Smoking status: Never   Smokeless tobacco: Never  Vaping Use   Vaping Use: Never used  Substance and Sexual Activity   Alcohol use: Yes   Drug use: Never   Sexual activity: Not on file  Other Topics Concern   Not on file  Social History Narrative   Not on file   Social Determinants of Health   Financial Resource Strain: Not on file  Food Insecurity: Not on file  Transportation Needs: Not on file  Physical Activity: Not on file  Stress: Not on file  Social Connections: Not on file  Intimate Partner Violence: Not on file    No family history on file.  No Known Allergies  Review of Systems  All other systems reviewed and are negative.      Objective:   BP 120/80   Pulse 81   Ht 5\' 9"  (1.753 m)   Wt 173 lb 12.8 oz (78.8 kg)   SpO2 96%   BMI 25.67 kg/m   Vitals:   01/29/23 0828  BP:  120/80  Pulse: 81  Height: 5\' 9"  (1.753 m)  Weight: 173 lb 12.8 oz (78.8 kg)  SpO2: 96%  BMI (Calculated): 25.65    Physical Exam Vitals reviewed.  Constitutional:      Appearance: Normal appearance.  HENT:     Head: Normocephalic.     Left Ear: There is no impacted cerumen.     Nose: Nose normal.     Mouth/Throat:     Mouth: Mucous membranes are moist.     Pharynx: No posterior oropharyngeal erythema.  Eyes:     Extraocular Movements: Extraocular movements intact.     Pupils: Pupils are equal, round, and reactive to light.  Cardiovascular:     Rate and Rhythm: Regular rhythm.     Chest Wall: PMI is not displaced.     Pulses: Normal pulses.     Heart sounds: Normal heart sounds. No murmur heard. Pulmonary:     Effort: Pulmonary effort is normal.     Breath sounds: Normal air entry. No rhonchi or rales.  Abdominal:     General: Abdomen is flat. Bowel sounds are normal. There is no distension.     Palpations: Abdomen is soft. There is  no hepatomegaly, splenomegaly or mass.     Tenderness: There is no abdominal tenderness.  Musculoskeletal:        General: Normal range of motion.     Cervical back: Normal range of motion and neck supple.     Right lower leg: No edema.     Left lower leg: No edema.  Skin:    General: Skin is warm and dry.  Neurological:     General: No focal deficit present.     Mental Status: He is alert and oriented to person, place, and time.     Cranial Nerves: No cranial nerve deficit.     Motor: No weakness.  Psychiatric:        Mood and Affect: Mood normal.        Behavior: Behavior normal.      Results for orders placed or performed in visit on 01/29/23  POCT Glucose (CBG)  Result Value Ref Range   POC Glucose 199 (A) 70 - 99 mg/dl    Recent Results (from the past 2160 hour(Asuzena Weis))  POCT Glucose (CBG)     Status: Abnormal   Collection Time: 01/29/23  8:37 AM  Result Value Ref Range   POC Glucose 199 (A) 70 - 99 mg/dl      Assessment  & Plan:   Problem List Items Addressed This Visit   None Visit Diagnoses     Type 2 diabetes mellitus without complication, without long-term current use of insulin (HCC)    -  Primary   Relevant Orders   POCT Glucose (CBG) (Completed)       No follow-ups on file.   Total time spent: 30 minutes  Luna Fuse, MD  01/29/2023   This document may have been prepared by [facility] Voice Recognition software and as such may include unintentional dictation errors.

## 2023-02-18 ENCOUNTER — Ambulatory Visit: Payer: PRIVATE HEALTH INSURANCE | Admitting: Internal Medicine

## 2023-03-19 ENCOUNTER — Ambulatory Visit: Payer: PRIVATE HEALTH INSURANCE | Admitting: Internal Medicine

## 2023-04-01 ENCOUNTER — Encounter: Payer: Self-pay | Admitting: Internal Medicine

## 2023-04-01 ENCOUNTER — Ambulatory Visit: Payer: Self-pay | Admitting: Internal Medicine

## 2023-04-01 ENCOUNTER — Other Ambulatory Visit: Payer: Self-pay

## 2023-04-01 ENCOUNTER — Ambulatory Visit: Payer: PRIVATE HEALTH INSURANCE | Admitting: Internal Medicine

## 2023-04-01 MED ORDER — PAXLOVID (300/100) 20 X 150 MG & 10 X 100MG PO TBPK: 3.0000 | ORAL_TABLET | Freq: Two times a day (BID) | ORAL | 0 refills | Status: AC

## 2023-06-03 ENCOUNTER — Telehealth: Payer: Self-pay

## 2023-06-04 NOTE — Telephone Encounter (Signed)
Patient was left a voicemail informing that patient needs to come in for an appointment with PCP.

## 2023-06-25 ENCOUNTER — Telehealth: Payer: Self-pay | Admitting: Internal Medicine

## 2023-06-25 ENCOUNTER — Ambulatory Visit: Payer: PRIVATE HEALTH INSURANCE | Admitting: Internal Medicine

## 2023-06-25 ENCOUNTER — Other Ambulatory Visit: Payer: Self-pay | Admitting: Internal Medicine

## 2023-06-25 ENCOUNTER — Other Ambulatory Visit: Payer: Self-pay

## 2023-06-25 DIAGNOSIS — Z794 Long term (current) use of insulin: Secondary | ICD-10-CM

## 2023-06-25 DIAGNOSIS — I10 Essential (primary) hypertension: Secondary | ICD-10-CM

## 2023-06-25 DIAGNOSIS — E782 Mixed hyperlipidemia: Secondary | ICD-10-CM

## 2023-06-25 NOTE — Telephone Encounter (Signed)
Patient left VM that he needed his lab orders for fructosamine. Orders released to [facility] and patient informed.

## 2023-06-27 ENCOUNTER — Encounter: Payer: Self-pay | Admitting: Internal Medicine

## 2023-06-27 ENCOUNTER — Ambulatory Visit: Payer: PRIVATE HEALTH INSURANCE | Admitting: Internal Medicine

## 2023-06-27 VITALS — BP 120/75 | HR 70 | Ht 69.0 in | Wt 174.2 lb

## 2023-06-27 DIAGNOSIS — I10 Essential (primary) hypertension: Secondary | ICD-10-CM | POA: Diagnosis not present

## 2023-06-27 DIAGNOSIS — K219 Gastro-esophageal reflux disease without esophagitis: Secondary | ICD-10-CM

## 2023-06-27 DIAGNOSIS — E1165 Type 2 diabetes mellitus with hyperglycemia: Secondary | ICD-10-CM | POA: Diagnosis not present

## 2023-06-27 DIAGNOSIS — Z794 Long term (current) use of insulin: Secondary | ICD-10-CM | POA: Diagnosis not present

## 2023-06-27 DIAGNOSIS — N400 Enlarged prostate without lower urinary tract symptoms: Secondary | ICD-10-CM

## 2023-06-27 DIAGNOSIS — E119 Type 2 diabetes mellitus without complications: Secondary | ICD-10-CM

## 2023-06-27 DIAGNOSIS — Z23 Encounter for immunization: Secondary | ICD-10-CM | POA: Diagnosis not present

## 2023-06-27 LAB — POC CREATINE & ALBUMIN,URINE
Albumin/Creatinine Ratio, Urine, POC: <30
Creatinine, POC: 100 mg/dL
Microalbumin Ur, POC: 10 mg/L

## 2023-06-27 LAB — POCT CBG (FASTING - GLUCOSE)-MANUAL ENTRY: Glucose Fasting, POC: 115 mg/dL — AB (ref 70–99)

## 2023-06-27 MED ORDER — TRESIBA FLEXTOUCH 200 UNIT/ML ~~LOC~~ SOPN
14.0000 [IU] | PEN_INJECTOR | Freq: Every day | SUBCUTANEOUS | 1 refills | Status: DC
Start: 2023-06-27 — End: 2023-07-09

## 2023-06-27 MED ORDER — DAPAGLIFLOZIN PROPANEDIOL 10 MG PO TABS
10.0000 mg | ORAL_TABLET | Freq: Every day | ORAL | 1 refills | Status: DC
Start: 2023-06-27 — End: 2024-01-12

## 2023-06-27 MED ORDER — RAMIPRIL 2.5 MG PO CAPS: 2.5000 mg | ORAL_CAPSULE | Freq: Every day | ORAL | 0 refills | Status: DC

## 2023-06-27 MED ORDER — PANTOPRAZOLE SODIUM 20 MG PO TBEC
20.0000 mg | DELAYED_RELEASE_TABLET | Freq: Every day | ORAL | 0 refills | Status: DC
Start: 2023-06-27 — End: 2024-01-12

## 2023-06-27 MED ORDER — PANTOPRAZOLE SODIUM 20 MG PO TBEC: 20.0000 mg | DELAYED_RELEASE_TABLET | Freq: Every day | ORAL | 0 refills | Status: DC

## 2023-06-27 NOTE — Progress Notes (Signed)
Established Patient Office Visit  Subjective:  Patient ID: Aaron Walsh, male    DOB: 08-Mar-1966  Age: 57 y.o. MRN: [id number]  Chief Complaint  Patient presents with   Follow-up    Leg pain    No new complaints, here for lab review and medication refills. Labs unavailable. Denies any hypoglycemic episodes but home bg readings have been elevated with an average glucose of 170 per CGM device.   No other concerns at this time.   Past Medical History:  Diagnosis Date   Asthma    Diabetes mellitus without complication (HCC)     No past surgical history on file.  Social History   Socioeconomic History   Marital status: Married    Spouse name: Not on file   Number of children: Not on file   Years of education: Not on file   Highest education level: Not on file  Occupational History   Not on file  Tobacco Use   Smoking status: Never   Smokeless tobacco: Never  Vaping Use   Vaping status: Never Used  Substance and Sexual Activity   Alcohol use: Yes   Drug use: Never   Sexual activity: Not on file  Other Topics Concern   Not on file  Social History Narrative   Not on file   Social Determinants of Health   Financial Resource Strain: Not on file  Food Insecurity: Not on file  Transportation Needs: Not on file  Physical Activity: Not on file  Stress: Not on file  Social Connections: Not on file  Intimate Partner Violence: Not on file    No family history on file.  No Known Allergies  Review of Systems  All other systems reviewed and are negative.      Objective:   BP 120/75   Pulse 70   Ht 5\' 9"  (1.753 m)   Wt 174 lb 3.2 oz (79 kg)   SpO2 97%   BMI 25.72 kg/m   Vitals:   06/27/23 1150  BP: 120/75  Pulse: 70  Height: 5\' 9"  (1.753 m)  Weight: 174 lb 3.2 oz (79 kg)  SpO2: 97%  BMI (Calculated): 25.71    Physical Exam Vitals reviewed.  Constitutional:      Appearance: Normal appearance.  HENT:     Head: Normocephalic.     Left Ear: There  is no impacted cerumen.     Nose: Nose normal.     Mouth/Throat:     Mouth: Mucous membranes are moist.     Pharynx: No posterior oropharyngeal erythema.  Eyes:     Extraocular Movements: Extraocular movements intact.     Pupils: Pupils are equal, round, and reactive to light.  Cardiovascular:     Rate and Rhythm: Regular rhythm.     Chest Wall: PMI is not displaced.     Pulses: Normal pulses.     Heart sounds: Normal heart sounds. No murmur heard. Pulmonary:     Effort: Pulmonary effort is normal.     Breath sounds: Normal air entry. No rhonchi or rales.  Abdominal:     General: Abdomen is flat. Bowel sounds are normal. There is no distension.     Palpations: Abdomen is soft. There is no hepatomegaly, splenomegaly or mass.     Tenderness: There is no abdominal tenderness.  Musculoskeletal:        General: Normal range of motion.     Cervical back: Normal range of motion and neck supple.  Right lower leg: No edema.     Left lower leg: No edema.  Skin:    General: Skin is warm and dry.  Neurological:     General: No focal deficit present.     Mental Status: He is alert and oriented to person, place, and time.     Cranial Nerves: No cranial nerve deficit.     Motor: No weakness.  Psychiatric:        Mood and Affect: Mood normal.        Behavior: Behavior normal.      Results for orders placed or performed in visit on 06/27/23  POCT CBG (Fasting - Glucose)  Result Value Ref Range   Glucose Fasting, POC 115 (A) 70 - 99 mg/dL  POC CREATINE & ALBUMIN,URINE  Result Value Ref Range   Microalbumin Ur, POC 10 mg/L   Creatinine, POC 100 mg/dL   Albumin/Creatinine Ratio, Urine, POC <30     Recent Results (from the past 2160 hour(Jyden Kromer))  POCT CBG (Fasting - Glucose)     Status: Abnormal   Collection Time: 06/27/23 11:58 AM  Result Value Ref Range   Glucose Fasting, POC 115 (A) 70 - 99 mg/dL  POC CREATINE & ALBUMIN,URINE     Status: None   Collection Time: 06/27/23 12:21 PM   Result Value Ref Range   Microalbumin Ur, POC 10 mg/L   Creatinine, POC 100 mg/dL   Albumin/Creatinine Ratio, Urine, POC <30       Assessment & Plan:  As per problem list  Problem List Items Addressed This Visit       Cardiovascular and Mediastinum   Essential hypertension, benign   Relevant Medications   ramipril (ALTACE) 2.5 MG capsule   Other Relevant Orders   Comprehensive metabolic panel   CBC with Diff     Endocrine   Type 2 diabetes mellitus with hyperglycemia, with long-term current use of insulin (HCC) - Primary   Relevant Medications   insulin degludec (TRESIBA FLEXTOUCH) 200 UNIT/ML FlexTouch Pen   dapagliflozin propanediol (FARXIGA) 10 MG TABS tablet   ramipril (ALTACE) 2.5 MG capsule   Other Relevant Orders   POCT CBG (Fasting - Glucose) (Completed)   Fructosamine   POC CREATINE & ALBUMIN,URINE (Completed)   Other Visit Diagnoses     Diabetes mellitus without complication (HCC)       Relevant Medications   insulin degludec (TRESIBA FLEXTOUCH) 200 UNIT/ML FlexTouch Pen   dapagliflozin propanediol (FARXIGA) 10 MG TABS tablet   ramipril (ALTACE) 2.5 MG capsule   Benign prostatic hyperplasia without lower urinary tract symptoms       Relevant Orders   PSA   Gastroesophageal reflux disease without esophagitis       Relevant Medications   pantoprazole (PROTONIX) 20 MG tablet   Needs flu shot       Relevant Orders   Influenza, MDCK, trivalent, PF(Flucelvax egg-free) (Completed)       Return in about 6 weeks (around 08/08/2023) for cpe with labs prior.   Total time spent: 20 minutes  Luna Fuse, MD  06/27/2023   This document may have been prepared by [facility] Voice Recognition software and as such may include unintentional dictation errors.

## 2023-07-08 ENCOUNTER — Other Ambulatory Visit: Payer: Self-pay | Admitting: Internal Medicine

## 2023-07-08 DIAGNOSIS — Z794 Long term (current) use of insulin: Secondary | ICD-10-CM

## 2023-07-08 DIAGNOSIS — E119 Type 2 diabetes mellitus without complications: Secondary | ICD-10-CM

## 2023-07-29 ENCOUNTER — Telehealth: Payer: Self-pay | Admitting: Internal Medicine

## 2023-07-29 NOTE — Telephone Encounter (Signed)
Patient left VM stating that he is having bad body aches. Says it is his lower back, mid back, both legs and has shooting pains down his legs. He wants to know if he needs to start a prescription for this or does he need a referral to a specialist? States he has been taking Tylenol, ibuprofen, and using warm compresses with no relief. Please advise.

## 2023-08-12 ENCOUNTER — Encounter: Payer: Self-pay | Admitting: Internal Medicine

## 2023-08-12 ENCOUNTER — Ambulatory Visit (INDEPENDENT_AMBULATORY_CARE_PROVIDER_SITE_OTHER): Payer: PRIVATE HEALTH INSURANCE | Admitting: Internal Medicine

## 2023-08-12 VITALS — BP 139/82 | HR 70 | Ht 69.0 in | Wt 174.0 lb

## 2023-08-12 DIAGNOSIS — I10 Essential (primary) hypertension: Secondary | ICD-10-CM

## 2023-08-12 DIAGNOSIS — E782 Mixed hyperlipidemia: Secondary | ICD-10-CM

## 2023-08-12 DIAGNOSIS — Z0001 Encounter for general adult medical examination with abnormal findings: Secondary | ICD-10-CM | POA: Diagnosis not present

## 2023-08-12 DIAGNOSIS — E1165 Type 2 diabetes mellitus with hyperglycemia: Secondary | ICD-10-CM | POA: Diagnosis not present

## 2023-08-12 DIAGNOSIS — M5386 Other specified dorsopathies, lumbar region: Secondary | ICD-10-CM | POA: Insufficient documentation

## 2023-08-12 DIAGNOSIS — N400 Enlarged prostate without lower urinary tract symptoms: Secondary | ICD-10-CM

## 2023-08-12 DIAGNOSIS — Z794 Long term (current) use of insulin: Secondary | ICD-10-CM

## 2023-08-12 NOTE — Progress Notes (Signed)
Established Patient Office Visit  Subjective:  Patient ID: Aaron Walsh, male    DOB: [birth year]  Age: 57 y.o. MRN: [id number]  No chief complaint on file.   No new complaints, here for CPE, lab review and medication refills. Failed to have previsit labs done. Denies any hypoglycemic episodes and home bg readings have been at target although recent spike while on prednisone. Improvement in sciatic pain on current therapy.  No other concerns at this time.   Past Medical History:  Diagnosis Date   Asthma    Diabetes mellitus without complication (HCC)     No past surgical history on file.  Social History   Socioeconomic History   Marital status: Married    Spouse name: Not on file   Number of children: Not on file   Years of education: Not on file   Highest education level: Not on file  Occupational History   Not on file  Tobacco Use   Smoking status: Never   Smokeless tobacco: Never  Vaping Use   Vaping status: Never Used  Substance and Sexual Activity   Alcohol use: Yes   Drug use: Never   Sexual activity: Not on file  Other Topics Concern   Not on file  Social History Narrative   Not on file   Social Determinants of Health   Financial Resource Strain: Not on file  Food Insecurity: Not on file  Transportation Needs: Not on file  Physical Activity: Not on file  Stress: Not on file  Social Connections: Not on file  Intimate Partner Violence: Not on file    No family history on file.  No Known Allergies  Outpatient Medications Prior to Visit  Medication Sig   aspirin EC 81 MG tablet Aspirin 81 mg   B-D ULTRAFINE III SHORT PEN 31G X 8 MM MISC USE AS DIRECTED   benzonatate (TESSALON PERLES) 100 MG capsule Take 1 capsule (100 mg total) by mouth 3 (three) times daily as needed for cough.   Budeson-Glycopyrrol-Formoterol (BREZTRI AEROSPHERE) 160-9-4.8 MCG/ACT AERO Inhale 2 puffs into the lungs in the morning and at bedtime.   dapagliflozin propanediol  (FARXIGA) 10 MG TABS tablet Take 1 tablet (10 mg total) by mouth daily.   fluticasone (FLONASE) 50 MCG/ACT nasal spray Place 2 sprays into both nostrils daily.   gabapentin (NEURONTIN) 300 MG capsule Take 300 mg by mouth 2 (two) times daily.   JANUMET 50-1000 MG tablet Take 1 tablet by mouth 2 (two) times daily with a meal.   meloxicam (MOBIC) 7.5 MG tablet Take 7.5 mg by mouth 2 (two) times daily.   Multiple Vitamin (MULTIVITAMIN ADULT PO) Take 1 tablet by mouth daily.   pantoprazole (PROTONIX) 20 MG tablet Take 1 tablet (20 mg total) by mouth daily.   predniSONE (DELTASONE) 10 MG tablet Take 10 mg by mouth daily with breakfast.   ramipril (ALTACE) 2.5 MG capsule Take 1 capsule (2.5 mg total) by mouth daily.   TRESIBA FLEXTOUCH 200 UNIT/ML FlexTouch Pen INJECT 10 UNITS UNDER THE SKIN DAILY   atorvastatin (LIPITOR) 10 MG tablet Take 1 tablet (10 mg total) by mouth daily.   No facility-administered medications prior to visit.    Review of Systems  Constitutional:  Negative for malaise/fatigue and weight loss.  HENT:  Positive for congestion.   Eyes: Negative.   Respiratory: Negative.    Cardiovascular: Negative.   Gastrointestinal: Negative.   Genitourinary: Negative.   Musculoskeletal:  Positive for back pain.  Neurological:  Radicular pain down both legs  Endo/Heme/Allergies: Negative.   Psychiatric/Behavioral: Negative.         Was dissatisfied with a family therapist.       Objective:   BP 139/82   Pulse 70   Ht 5\' 9"  (1.753 m)   Wt 174 lb (78.9 kg)   SpO2 97%   BMI 25.70 kg/m   Vitals:   08/12/23 1112  BP: 139/82  Pulse: 70  Height: 5\' 9"  (1.753 m)  Weight: 174 lb (78.9 kg)  SpO2: 97%  BMI (Calculated): 25.68    Physical Exam Vitals reviewed.  Constitutional:      Appearance: Normal appearance.  HENT:     Head: Normocephalic.     Left Ear: There is no impacted cerumen.     Nose: Nose normal.     Mouth/Throat:     Mouth: Mucous membranes are  moist.     Pharynx: No posterior oropharyngeal erythema.  Eyes:     Extraocular Movements: Extraocular movements intact.     Pupils: Pupils are equal, round, and reactive to light.  Cardiovascular:     Rate and Rhythm: Regular rhythm.     Chest Wall: PMI is not displaced.     Pulses: Normal pulses.     Heart sounds: Normal heart sounds. No murmur heard. Pulmonary:     Effort: Pulmonary effort is normal.     Breath sounds: Normal air entry. No rhonchi or rales.  Abdominal:     General: Abdomen is flat. Bowel sounds are normal. There is no distension.     Palpations: Abdomen is soft. There is no hepatomegaly, splenomegaly or mass.     Tenderness: There is no abdominal tenderness.  Musculoskeletal:        General: Normal range of motion.     Cervical back: Normal range of motion and neck supple.     Right lower leg: No edema.     Left lower leg: No edema.  Skin:    General: Skin is warm and dry.  Neurological:     General: No focal deficit present.     Mental Status: He is alert and oriented to person, place, and time.     Cranial Nerves: No cranial nerve deficit.     Motor: No weakness.  Psychiatric:        Mood and Affect: Mood normal.        Behavior: Behavior normal.      No results found for any visits on 08/12/23.  Recent Results (from the past 2160 hour(Montserrat Shek))  POCT CBG (Fasting - Glucose)     Status: Abnormal   Collection Time: 06/27/23 11:58 AM  Result Value Ref Range   Glucose Fasting, POC 115 (A) 70 - 99 mg/dL  POC CREATINE & ALBUMIN,URINE     Status: None   Collection Time: 06/27/23 12:21 PM  Result Value Ref Range   Microalbumin Ur, POC 10 mg/L   Creatinine, POC 100 mg/dL   Albumin/Creatinine Ratio, Urine, POC <30       Assessment & Plan:  As per problem list. High depression score but not ready to try medications. Problem List Items Addressed This Visit       Cardiovascular and Mediastinum   Essential hypertension, benign     Endocrine   Type 2  diabetes mellitus with hyperglycemia, with long-term current use of insulin (HCC) - Primary     Nervous and Auditory   Sciatica associated with disorder of lumbar spine  Relevant Medications   gabapentin (NEURONTIN) 300 MG capsule     Other   Mixed hyperlipidemia   Other Visit Diagnoses     Benign prostatic hyperplasia without lower urinary tract symptoms           Return in about 2 weeks (around 08/26/2023) for lab results.   Total time spent: 20 minutes  Luna Fuse, MD  08/12/2023   This document may have been prepared by [facility] Voice Recognition software and as such may include unintentional dictation errors.

## 2023-08-26 ENCOUNTER — Ambulatory Visit: Payer: PRIVATE HEALTH INSURANCE | Admitting: Internal Medicine

## 2023-09-12 ENCOUNTER — Encounter: Payer: Self-pay | Admitting: Internal Medicine

## 2024-01-07 ENCOUNTER — Other Ambulatory Visit: Payer: Self-pay | Admitting: Internal Medicine

## 2024-01-07 DIAGNOSIS — E119 Type 2 diabetes mellitus without complications: Secondary | ICD-10-CM

## 2024-01-07 DIAGNOSIS — I10 Essential (primary) hypertension: Secondary | ICD-10-CM

## 2024-01-09 ENCOUNTER — Telehealth: Payer: Self-pay

## 2024-01-09 NOTE — Telephone Encounter (Signed)
 Patient called about his labs if we had received them and states that he needs to make an appt if someone could call him to get that scheduled

## 2024-01-12 ENCOUNTER — Encounter: Payer: Self-pay | Admitting: Internal Medicine

## 2024-01-12 ENCOUNTER — Ambulatory Visit (INDEPENDENT_AMBULATORY_CARE_PROVIDER_SITE_OTHER): Payer: PRIVATE HEALTH INSURANCE | Admitting: Internal Medicine

## 2024-01-12 VITALS — BP 120/80 | HR 72 | Temp 97.2°F | Ht 69.0 in | Wt 174.8 lb

## 2024-01-12 DIAGNOSIS — J45998 Other asthma: Secondary | ICD-10-CM

## 2024-01-12 DIAGNOSIS — K219 Gastro-esophageal reflux disease without esophagitis: Secondary | ICD-10-CM

## 2024-01-12 DIAGNOSIS — Z794 Long term (current) use of insulin: Secondary | ICD-10-CM | POA: Diagnosis not present

## 2024-01-12 DIAGNOSIS — I10 Essential (primary) hypertension: Secondary | ICD-10-CM

## 2024-01-12 DIAGNOSIS — E119 Type 2 diabetes mellitus without complications: Secondary | ICD-10-CM

## 2024-01-12 DIAGNOSIS — E1165 Type 2 diabetes mellitus with hyperglycemia: Secondary | ICD-10-CM | POA: Diagnosis not present

## 2024-01-12 LAB — POC CREATINE & ALBUMIN,URINE
Creatinine, POC: 100 mg/dL
Microalbumin Ur, POC: 80 mg/L

## 2024-01-12 LAB — GLUCOSE, POCT (MANUAL RESULT ENTRY): POC Glucose: 146 mg/dL — AB (ref 70–99)

## 2024-01-12 MED ORDER — DAPAGLIFLOZIN PROPANEDIOL 10 MG PO TABS: 10.0000 mg | ORAL_TABLET | Freq: Every day | ORAL | 1 refills | Status: DC

## 2024-01-12 MED ORDER — RAMIPRIL 2.5 MG PO CAPS
2.5000 mg | ORAL_CAPSULE | Freq: Every day | ORAL | 0 refills | Status: AC
Start: 2024-01-12 — End: 2024-04-11

## 2024-01-12 MED ORDER — JANUMET 50-1000 MG PO TABS
1.0000 | ORAL_TABLET | Freq: Two times a day (BID) | ORAL | 0 refills | Status: DC
Start: 2024-01-12 — End: 2024-03-31

## 2024-01-12 MED ORDER — TRESIBA FLEXTOUCH 200 UNIT/ML ~~LOC~~ SOPN: 20.0000 [IU] | PEN_INJECTOR | SUBCUTANEOUS | 0 refills | Status: DC

## 2024-01-12 MED ORDER — BREZTRI AEROSPHERE 160-9-4.8 MCG/ACT IN AERO: 2.0000 | INHALATION_SPRAY | Freq: Two times a day (BID) | RESPIRATORY_TRACT | 3 refills | Status: DC

## 2024-01-12 MED ORDER — PANTOPRAZOLE SODIUM 20 MG PO TBEC: 20.0000 mg | DELAYED_RELEASE_TABLET | Freq: Every day | ORAL | 0 refills | Status: DC

## 2024-01-12 NOTE — Progress Notes (Signed)
 Established Patient Office Visit  Subjective:  Patient ID: Aaron Walsh, male    DOB: 22-Jan-1966  Age: 57 y.o. MRN: [id number]  Chief Complaint  Patient presents with   Follow-up    Follow up    No new complaints, here for lab review and medication refills. Unable to track down lab results however on review of Freestyle libre readings, average readings were 220 over 7, 14 and 29 days. Still c/o lower back pain for which he'Ari Bernabei followed by Ortho. Stopped Rybelsus due to excessive weight loss.   No other concerns at this time.   Past Medical History:  Diagnosis Date   Asthma    Diabetes mellitus without complication (HCC)     No past surgical history on file.  Social History   Socioeconomic History   Marital status: Married    Spouse name: Not on file   Number of children: Not on file   Years of education: Not on file   Highest education level: Not on file  Occupational History   Not on file  Tobacco Use   Smoking status: Never   Smokeless tobacco: Never  Vaping Use   Vaping status: Never Used  Substance and Sexual Activity   Alcohol use: Yes   Drug use: Never   Sexual activity: Not on file  Other Topics Concern   Not on file  Social History Narrative   Not on file   Social Drivers of Health   Financial Resource Strain: Not on file  Food Insecurity: Not on file  Transportation Needs: Not on file  Physical Activity: Not on file  Stress: Not on file  Social Connections: Not on file  Intimate Partner Violence: Not on file    No family history on file.  No Known Allergies  Outpatient Medications Prior to Visit  Medication Sig Note   aspirin  EC 81 MG tablet Aspirin  81 mg    atorvastatin  (LIPITOR) 10 MG tablet Take 1 tablet (10 mg total) by mouth daily.    B-D ULTRAFINE III SHORT PEN 31G X 8 MM MISC USE AS DIRECTED    fluticasone  (FLONASE ) 50 MCG/ACT nasal spray Place 2 sprays into both nostrils daily.    gabapentin (NEURONTIN) 300 MG capsule Take 300 mg by  mouth 2 (two) times daily.    Multiple Vitamin (MULTIVITAMIN ADULT PO) Take 1 tablet by mouth daily.    predniSONE  (DELTASONE ) 10 MG tablet Take 10 mg by mouth daily with breakfast.    [DISCONTINUED] Budeson-Glycopyrrol-Formoterol (BREZTRI  AEROSPHERE) 160-9-4.8 MCG/ACT AERO Inhale 2 puffs into the lungs in the morning and at bedtime.    [DISCONTINUED] dapagliflozin  propanediol (FARXIGA ) 10 MG TABS tablet Take 1 tablet (10 mg total) by mouth daily.    [DISCONTINUED] JANUMET  50-1000 MG tablet TAKE 1 TABLET TWICE A DAY WITH MEALS    [DISCONTINUED] pantoprazole  (PROTONIX ) 20 MG tablet Take 1 tablet (20 mg total) by mouth daily.    [DISCONTINUED] ramipril  (ALTACE ) 2.5 MG capsule Take 1 capsule (2.5 mg total) by mouth daily.    [DISCONTINUED] TRESIBA  FLEXTOUCH 200 UNIT/ML FlexTouch Pen INJECT 10 UNITS UNDER THE SKIN DAILY    meloxicam (MOBIC) 7.5 MG tablet Take 7.5 mg by mouth 2 (two) times daily. (Patient not taking: Reported on 01/12/2024) 01/12/2024: Needs refill   No facility-administered medications prior to visit.    ROS     Objective:   BP 120/80   Pulse 72   Temp (!) 97.2 F (36.2 C)   Ht 5\' 9"  (1.753 m)  Wt 174 lb 12.8 oz (79.3 kg)   SpO2 97%   BMI 25.81 kg/m   Vitals:   01/12/24 1114  BP: 120/80  Pulse: 72  Temp: (!) 97.2 F (36.2 C)  Height: 5\' 9"  (1.753 m)  Weight: 174 lb 12.8 oz (79.3 kg)  SpO2: 97%  BMI (Calculated): 25.8    Physical Exam Vitals reviewed.  Constitutional:      Appearance: Normal appearance.  HENT:     Head: Normocephalic.     Left Ear: There is no impacted cerumen.     Nose: Nose normal.     Mouth/Throat:     Mouth: Mucous membranes are moist.     Pharynx: No posterior oropharyngeal erythema.  Eyes:     Extraocular Movements: Extraocular movements intact.     Pupils: Pupils are equal, round, and reactive to light.  Cardiovascular:     Rate and Rhythm: Regular rhythm.     Chest Wall: PMI is not displaced.     Pulses: Normal pulses.      Heart sounds: Normal heart sounds. No murmur heard. Pulmonary:     Effort: Pulmonary effort is normal.     Breath sounds: Normal air entry. No rhonchi or rales.  Abdominal:     General: Abdomen is flat. Bowel sounds are normal. There is no distension.     Palpations: Abdomen is soft. There is no hepatomegaly, splenomegaly or mass.     Tenderness: There is no abdominal tenderness.  Musculoskeletal:        General: Normal range of motion.     Cervical back: Normal range of motion and neck supple.     Right lower leg: No edema.     Left lower leg: No edema.  Skin:    General: Skin is warm and dry.  Neurological:     General: No focal deficit present.     Mental Status: He is alert and oriented to person, place, and time.     Cranial Nerves: No cranial nerve deficit.     Motor: No weakness.  Psychiatric:        Mood and Affect: Mood normal.        Behavior: Behavior normal.      Results for orders placed or performed in visit on 01/12/24  POCT Glucose (CBG)  Result Value Ref Range   POC Glucose 146 (A) 70 - 99 mg/dl    Recent Results (from the past 2160 hours)  POCT Glucose (CBG)     Status: Abnormal   Collection Time: 01/12/24 11:22 AM  Result Value Ref Range   POC Glucose 146 (A) 70 - 99 mg/dl      Assessment & Plan:  As per problem list. Increase insulin .  Problem List Items Addressed This Visit       Cardiovascular and Mediastinum   Essential hypertension, benign   Relevant Medications   ramipril  (ALTACE ) 2.5 MG capsule     Endocrine   Type 2 diabetes mellitus with hyperglycemia, with long-term current use of insulin  (HCC) - Primary   Relevant Medications   JANUMET  50-1000 MG tablet   insulin  degludec (TRESIBA  FLEXTOUCH) 200 UNIT/ML FlexTouch Pen   dapagliflozin  propanediol (FARXIGA ) 10 MG TABS tablet   ramipril  (ALTACE ) 2.5 MG capsule   Other Relevant Orders   POCT Glucose (CBG) (Completed)   Fructosamine   POC CREATINE & ALBUMIN,URINE   Other Visit  Diagnoses       Diabetes mellitus without complication (HCC)  Relevant Medications   JANUMET  50-1000 MG tablet   insulin  degludec (TRESIBA  FLEXTOUCH) 200 UNIT/ML FlexTouch Pen   dapagliflozin  propanediol (FARXIGA ) 10 MG TABS tablet   ramipril  (ALTACE ) 2.5 MG capsule     Hypertension, essential       Relevant Medications   JANUMET  50-1000 MG tablet   ramipril  (ALTACE ) 2.5 MG capsule   budeson-glycopyrrolate-formoterol (BREZTRI  AEROSPHERE) 160-9-4.8 MCG/ACT AERO inhaler     Gastroesophageal reflux disease without esophagitis       Relevant Medications   pantoprazole  (PROTONIX ) 20 MG tablet     Asthma, persistent controlled       Relevant Medications   budeson-glycopyrrolate-formoterol (BREZTRI  AEROSPHERE) 160-9-4.8 MCG/ACT AERO inhaler       Return in about 6 weeks (around 02/23/2024).   Total time spent: 20 minutes  Arzella Bitters, MD  01/12/2024   This document may have been prepared by [facility] Voice Recognition software and as such may include unintentional dictation errors.

## 2024-01-27 ENCOUNTER — Telehealth: Payer: Self-pay

## 2024-01-27 ENCOUNTER — Other Ambulatory Visit: Payer: Self-pay | Admitting: Internal Medicine

## 2024-01-27 DIAGNOSIS — M5386 Other specified dorsopathies, lumbar region: Secondary | ICD-10-CM

## 2024-01-27 MED ORDER — MELOXICAM 7.5 MG PO TABS
7.5000 mg | ORAL_TABLET | Freq: Two times a day (BID) | ORAL | 0 refills | Status: DC
Start: 2024-01-27 — End: 2024-03-05

## 2024-01-27 NOTE — Telephone Encounter (Signed)
 Patient LM asking for pain meds to be sent in, states his ortho dr does not write for pain meds and he's hurting in his back and legs

## 2024-02-23 ENCOUNTER — Ambulatory Visit: Payer: PRIVATE HEALTH INSURANCE | Admitting: Internal Medicine

## 2024-03-04 ENCOUNTER — Other Ambulatory Visit: Payer: Self-pay | Admitting: Internal Medicine

## 2024-03-04 DIAGNOSIS — M5386 Other specified dorsopathies, lumbar region: Secondary | ICD-10-CM

## 2024-03-23 LAB — LIPID PANEL
Chol/HDL Ratio: 3.6 ratio (ref 0.0–5.0)
Cholesterol, Total: 170 mg/dL (ref 100–199)
HDL: 47 mg/dL (ref >39)
LDL Chol Calc (NIH): 110 mg/dL — ABNORMAL HIGH (ref 0–99)
Triglycerides: 66 mg/dL (ref 0–149)
VLDL Cholesterol Cal: 13 mg/dL (ref 5–40)

## 2024-03-23 LAB — HEMOGLOBIN A1C
Est. average glucose Bld gHb Est-mCnc: 186 mg/dL
Hgb A1c MFr Bld: 8.1 % — ABNORMAL HIGH (ref 4.8–5.6)

## 2024-03-23 LAB — FRUCTOSAMINE: Fructosamine: 309 umol/L — ABNORMAL HIGH (ref 0–285)

## 2024-03-31 ENCOUNTER — Ambulatory Visit (INDEPENDENT_AMBULATORY_CARE_PROVIDER_SITE_OTHER): Payer: PRIVATE HEALTH INSURANCE | Admitting: Internal Medicine

## 2024-03-31 ENCOUNTER — Ambulatory Visit: Payer: Self-pay | Admitting: Internal Medicine

## 2024-03-31 ENCOUNTER — Encounter: Payer: Self-pay | Admitting: Internal Medicine

## 2024-03-31 VITALS — BP 130/80 | HR 78 | Temp 97.6°F | Ht 69.0 in | Wt 172.4 lb

## 2024-03-31 DIAGNOSIS — E782 Mixed hyperlipidemia: Secondary | ICD-10-CM

## 2024-03-31 DIAGNOSIS — M5386 Other specified dorsopathies, lumbar region: Secondary | ICD-10-CM

## 2024-03-31 DIAGNOSIS — J45998 Other asthma: Secondary | ICD-10-CM

## 2024-03-31 DIAGNOSIS — I10 Essential (primary) hypertension: Secondary | ICD-10-CM | POA: Diagnosis not present

## 2024-03-31 DIAGNOSIS — E1165 Type 2 diabetes mellitus with hyperglycemia: Secondary | ICD-10-CM | POA: Diagnosis not present

## 2024-03-31 DIAGNOSIS — Z794 Long term (current) use of insulin: Secondary | ICD-10-CM | POA: Diagnosis not present

## 2024-03-31 DIAGNOSIS — E119 Type 2 diabetes mellitus without complications: Secondary | ICD-10-CM

## 2024-03-31 DIAGNOSIS — K219 Gastro-esophageal reflux disease without esophagitis: Secondary | ICD-10-CM

## 2024-03-31 LAB — GLUCOSE, POCT (MANUAL RESULT ENTRY): POC Glucose: 102 mg/dL — AB (ref 70–99)

## 2024-03-31 MED ORDER — BREZTRI AEROSPHERE 160-9-4.8 MCG/ACT IN AERO: 2.0000 | INHALATION_SPRAY | Freq: Two times a day (BID) | RESPIRATORY_TRACT | 3 refills | Status: AC

## 2024-03-31 MED ORDER — ATORVASTATIN CALCIUM 20 MG PO TABS: 20.0000 mg | ORAL_TABLET | Freq: Every day | ORAL | 11 refills | Status: AC

## 2024-03-31 MED ORDER — TRESIBA FLEXTOUCH 200 UNIT/ML ~~LOC~~ SOPN: 30.0000 [IU] | PEN_INJECTOR | SUBCUTANEOUS | 2 refills | Status: AC

## 2024-03-31 MED ORDER — MELOXICAM 7.5 MG PO TABS: 7.5000 mg | ORAL_TABLET | Freq: Two times a day (BID) | ORAL | 2 refills | Status: AC

## 2024-03-31 MED ORDER — PANTOPRAZOLE SODIUM 20 MG PO TBEC
20.0000 mg | DELAYED_RELEASE_TABLET | Freq: Every day | ORAL | 0 refills | Status: DC
Start: 2024-03-31 — End: 2024-07-05

## 2024-03-31 MED ORDER — DAPAGLIFLOZIN PROPANEDIOL 10 MG PO TABS: 10.0000 mg | ORAL_TABLET | Freq: Every day | ORAL | 1 refills | Status: DC

## 2024-03-31 MED ORDER — JANUMET 50-1000 MG PO TABS: 1.0000 | ORAL_TABLET | Freq: Two times a day (BID) | ORAL | 0 refills | Status: AC

## 2024-03-31 NOTE — Progress Notes (Signed)
 Established Patient Office Visit  Subjective:  Patient ID: Aaron Walsh, male    DOB: 11/10/1965  Age: 58 y.o. MRN: [id number]  Chief Complaint  Patient presents with   Follow-up    Discuss labs    No new complaints, here for lab review and medication refills. Labs reviewed and notable for uncontrolled diabetes, A1c improved to 8.1 but still not at target, lipids not at target either.     No other concerns at this time.   Past Medical History:  Diagnosis Date   Asthma    Diabetes mellitus without complication (HCC)     No past surgical history on file.  Social History   Socioeconomic History   Marital status: Married    Spouse name: Not on file   Number of children: Not on file   Years of education: Not on file   Highest education level: Not on file  Occupational History   Not on file  Tobacco Use   Smoking status: Never   Smokeless tobacco: Never  Vaping Use   Vaping status: Never Used  Substance and Sexual Activity   Alcohol use: Yes   Drug use: Never   Sexual activity: Not on file  Other Topics Concern   Not on file  Social History Narrative   Not on file   Social Drivers of Health   Financial Resource Strain: Not on file  Food Insecurity: Not on file  Transportation Needs: Not on file  Physical Activity: Not on file  Stress: Not on file  Social Connections: Not on file  Intimate Partner Violence: Not on file    No family history on file.  No Known Allergies  Outpatient Medications Prior to Visit  Medication Sig   aspirin  EC 81 MG tablet Aspirin  81 mg   B-D ULTRAFINE III SHORT PEN 31G X 8 MM MISC USE AS DIRECTED   fluticasone  (FLONASE ) 50 MCG/ACT nasal spray Place 2 sprays into both nostrils daily.   gabapentin (NEURONTIN) 300 MG capsule Take 300 mg by mouth 2 (two) times daily.   Multiple Vitamin (MULTIVITAMIN ADULT PO) Take 1 tablet by mouth daily.   ramipril  (ALTACE ) 2.5 MG capsule Take 1 capsule (2.5 mg total) by mouth daily.    [DISCONTINUED] atorvastatin  (LIPITOR) 10 MG tablet Take 1 tablet (10 mg total) by mouth daily.   [DISCONTINUED] budeson-glycopyrrolate-formoterol (BREZTRI  AEROSPHERE) 160-9-4.8 MCG/ACT AERO inhaler Inhale 2 puffs into the lungs in the morning and at bedtime.   [DISCONTINUED] dapagliflozin  propanediol (FARXIGA ) 10 MG TABS tablet Take 1 tablet (10 mg total) by mouth daily.   [DISCONTINUED] insulin  degludec (TRESIBA  FLEXTOUCH) 200 UNIT/ML FlexTouch Pen Inject 20 Units into the skin daily.   [DISCONTINUED] JANUMET  50-1000 MG tablet Take 1 tablet by mouth 2 (two) times daily with a meal.   [DISCONTINUED] meloxicam  (MOBIC ) 7.5 MG tablet TAKE 1 TABLET BY MOUTH 2 TIMES DAILY.   [DISCONTINUED] pantoprazole  (PROTONIX ) 20 MG tablet Take 1 tablet (20 mg total) by mouth daily.   predniSONE  (DELTASONE ) 10 MG tablet Take 10 mg by mouth daily with breakfast. (Patient not taking: Reported on 03/31/2024)   No facility-administered medications prior to visit.    Review of Systems  Constitutional:  Negative for malaise/fatigue and weight loss.  HENT:  Positive for congestion.   Eyes: Negative.   Respiratory: Negative.    Cardiovascular: Negative.   Gastrointestinal: Negative.   Genitourinary: Negative.   Musculoskeletal:  Positive for back pain.  Neurological:        Radicular  pain down both legs  Endo/Heme/Allergies: Negative.   Psychiatric/Behavioral: Negative.         Was dissatisfied with a family therapist.       Objective:   BP 130/80   Pulse 78   Temp 97.6 F (36.4 C)   Ht 5' 9 (1.753 m)   Wt 172 lb 6.4 oz (78.2 kg)   SpO2 100%   BMI 25.46 kg/m   Vitals:   03/31/24 1124  BP: 130/80  Pulse: 78  Temp: 97.6 F (36.4 C)  Height: 5' 9 (1.753 m)  Weight: 172 lb 6.4 oz (78.2 kg)  SpO2: 100%  BMI (Calculated): 25.45    Physical Exam Vitals reviewed.  Constitutional:      Appearance: Normal appearance.  HENT:     Head: Normocephalic.     Left Ear: There is no impacted cerumen.      Nose: Nose normal.     Mouth/Throat:     Mouth: Mucous membranes are moist.     Pharynx: No posterior oropharyngeal erythema.  Eyes:     Extraocular Movements: Extraocular movements intact.     Pupils: Pupils are equal, round, and reactive to light.  Cardiovascular:     Rate and Rhythm: Regular rhythm.     Chest Wall: PMI is not displaced.     Pulses: Normal pulses.     Heart sounds: Normal heart sounds. No murmur heard. Pulmonary:     Effort: Pulmonary effort is normal.     Breath sounds: Normal air entry. No rhonchi or rales.  Abdominal:     General: Abdomen is flat. Bowel sounds are normal. There is no distension.     Palpations: Abdomen is soft. There is no hepatomegaly, splenomegaly or mass.     Tenderness: There is no abdominal tenderness.  Musculoskeletal:        General: Normal range of motion.     Cervical back: Normal range of motion and neck supple.     Right lower leg: No edema.     Left lower leg: No edema.  Skin:    General: Skin is warm and dry.  Neurological:     General: No focal deficit present.     Mental Status: He is alert and oriented to person, place, and time.     Cranial Nerves: No cranial nerve deficit.     Motor: No weakness.  Psychiatric:        Mood and Affect: Mood normal.        Behavior: Behavior normal.      Results for orders placed or performed in visit on 03/31/24  POCT Glucose (CBG)  Result Value Ref Range   POC Glucose 102 (A) 70 - 99 mg/dl    Recent Results (from the past 2160 hours)  POCT Glucose (CBG)     Status: Abnormal   Collection Time: 01/12/24 11:22 AM  Result Value Ref Range   POC Glucose 146 (A) 70 - 99 mg/dl  POC CREATINE & ALBUMIN,URINE     Status: Abnormal   Collection Time: 01/12/24 11:49 AM  Result Value Ref Range   Microalbumin Ur, POC 80 mg/L   Creatinine, POC 100 mg/dL   Albumin/Creatinine Ratio, Urine, POC 30-300   Fructosamine     Status: Abnormal   Collection Time: 03/22/24 10:25 AM  Result Value  Ref Range   Fructosamine 309 (H) 0 - 285 umol/L    Comment: Published reference interval for apparently healthy subjects between age 69 and 2 is  205 - 285 umol/L and in a poorly controlled diabetic population is 228 - 563 umol/L with a mean of 396 umol/L.   Hemoglobin A1c     Status: Abnormal   Collection Time: 03/22/24 10:25 AM  Result Value Ref Range   Hgb A1c MFr Bld 8.1 (H) 4.8 - 5.6 %    Comment:          Prediabetes: 5.7 - 6.4          Diabetes: >6.4          Glycemic control for adults with diabetes: <7.0    Est. average glucose Bld gHb Est-mCnc 186 mg/dL  Lipid panel     Status: Abnormal   Collection Time: 03/22/24 10:25 AM  Result Value Ref Range   Cholesterol, Total 170 100 - 199 mg/dL   Triglycerides 66 0 - 149 mg/dL   HDL 47 >60 mg/dL   VLDL Cholesterol Cal 13 5 - 40 mg/dL   LDL Chol Calc (NIH) 889 (H) 0 - 99 mg/dL   Chol/HDL Ratio 3.6 0.0 - 5.0 ratio    Comment:                                   T. Chol/HDL Ratio                                             Men  Women                               1/2 Avg.Risk  3.4    3.3                                   Avg.Risk  5.0    4.4                                2X Avg.Risk  9.6    7.1                                3X Avg.Risk 23.4   11.0   POCT Glucose (CBG)     Status: Abnormal   Collection Time: 03/31/24 11:31 AM  Result Value Ref Range   POC Glucose 102 (A) 70 - 99 mg/dl      Assessment & Plan:  As per problem list. Increase insulin  and advised to eat snacks while at work on 3rd shift. Problem List Items Addressed This Visit       Endocrine   Type 2 diabetes mellitus with hyperglycemia, with long-term current use of insulin  (HCC) - Primary   Relevant Medications   atorvastatin  (LIPITOR) 20 MG tablet   insulin  degludec (TRESIBA  FLEXTOUCH) 200 UNIT/ML FlexTouch Pen   dapagliflozin  propanediol (FARXIGA ) 10 MG TABS tablet   JANUMET  50-1000 MG tablet   Other Relevant Orders   POCT Glucose (CBG) (Completed)    Fructosamine     Nervous and Auditory   Sciatica associated with disorder of lumbar spine   Relevant Medications   meloxicam  (MOBIC ) 7.5 MG tablet  Other   Mixed hyperlipidemia   Relevant Medications   atorvastatin  (LIPITOR) 20 MG tablet   Other Visit Diagnoses       Hypertension, essential       Relevant Medications   atorvastatin  (LIPITOR) 20 MG tablet   budesonide-glycopyrrolate-formoterol (BREZTRI  AEROSPHERE) 160-9-4.8 MCG/ACT AERO inhaler   JANUMET  50-1000 MG tablet     Asthma, persistent controlled       Relevant Medications   budesonide-glycopyrrolate-formoterol (BREZTRI  AEROSPHERE) 160-9-4.8 MCG/ACT AERO inhaler     Diabetes mellitus without complication (HCC)       Relevant Medications   atorvastatin  (LIPITOR) 20 MG tablet   insulin  degludec (TRESIBA  FLEXTOUCH) 200 UNIT/ML FlexTouch Pen   dapagliflozin  propanediol (FARXIGA ) 10 MG TABS tablet   JANUMET  50-1000 MG tablet     Gastroesophageal reflux disease without esophagitis       Relevant Medications   pantoprazole  (PROTONIX ) 20 MG tablet       Return in about 6 weeks (around 05/12/2024) for fu with labs prior.   Total time spent: 20 minutes  Sherrill Cinderella Perry, MD  03/31/2024   This document may have been prepared by [facility] Voice Recognition software and as such may include unintentional dictation errors.

## 2024-07-04 ENCOUNTER — Other Ambulatory Visit: Payer: Self-pay | Admitting: Internal Medicine

## 2024-07-04 DIAGNOSIS — K219 Gastro-esophageal reflux disease without esophagitis: Secondary | ICD-10-CM

## 2024-07-12 ENCOUNTER — Other Ambulatory Visit: Payer: Self-pay | Admitting: Internal Medicine

## 2024-07-12 DIAGNOSIS — E119 Type 2 diabetes mellitus without complications: Secondary | ICD-10-CM

## 2024-07-29 ENCOUNTER — Other Ambulatory Visit: Payer: Self-pay | Admitting: Internal Medicine

## 2024-07-29 DIAGNOSIS — K219 Gastro-esophageal reflux disease without esophagitis: Secondary | ICD-10-CM

## 2024-08-10 ENCOUNTER — Telehealth: Payer: Self-pay | Admitting: Internal Medicine

## 2024-08-10 DIAGNOSIS — Z794 Long term (current) use of insulin: Secondary | ICD-10-CM

## 2024-08-11 NOTE — Telephone Encounter (Signed)
 08/11/24 Spoke w/pt, let him know he could go have labs done, that Dr. Albina axe them in.

## 2024-08-20 ENCOUNTER — Ambulatory Visit: Payer: PRIVATE HEALTH INSURANCE | Admitting: Internal Medicine

## 2024-10-08 LAB — HEMOGLOBIN A1C
Est. average glucose Bld gHb Est-mCnc: 163 mg/dL
Hgb A1c MFr Bld: 7.3 % — ABNORMAL HIGH (ref 4.8–5.6)

## 2024-10-08 LAB — COMPREHENSIVE METABOLIC PANEL WITH GFR
ALT: 12 [IU]/L (ref 0–44)
AST: 17 [IU]/L (ref 0–40)
Albumin: 4.4 g/dL (ref 3.8–4.9)
Alkaline Phosphatase: 57 [IU]/L (ref 47–123)
BUN/Creatinine Ratio: 11 (ref 10–24)
BUN: 11 mg/dL (ref 8–27)
Bilirubin Total: 0.5 mg/dL (ref 0.0–1.2)
CO2: 22 mmol/L (ref 20–29)
Calcium: 10 mg/dL (ref 8.6–10.2)
Chloride: 106 mmol/L (ref 96–106)
Creatinine, Ser: 1.01 mg/dL (ref 0.76–1.27)
Globulin, Total: 2.3 g/dL (ref 1.5–4.5)
Glucose: 103 mg/dL — ABNORMAL HIGH (ref 70–99)
Potassium: 4.9 mmol/L (ref 3.5–5.2)
Sodium: 143 mmol/L (ref 134–144)
Total Protein: 6.7 g/dL (ref 6.0–8.5)
eGFR: 85 mL/min/{1.73_m2}

## 2024-10-08 LAB — LIPID PANEL
Chol/HDL Ratio: 4 ratio (ref 0.0–5.0)
Cholesterol, Total: 195 mg/dL (ref 100–199)
HDL: 49 mg/dL
LDL Chol Calc (NIH): 129 mg/dL — ABNORMAL HIGH (ref 0–99)
Triglycerides: 92 mg/dL (ref 0–149)
VLDL Cholesterol Cal: 17 mg/dL (ref 5–40)
